# Patient Record
Sex: Male | Born: 1955 | Race: White | Hispanic: No | Marital: Married | Smoking: Current every day smoker
Health system: Southern US, Community
[De-identification: ages and names within clinical notes are randomized; demographics above are authoritative.]

## PROBLEM LIST (undated history)

## (undated) DIAGNOSIS — I2510 Atherosclerotic heart disease of native coronary artery without angina pectoris: Secondary | ICD-10-CM

## (undated) DIAGNOSIS — E119 Type 2 diabetes mellitus without complications: Secondary | ICD-10-CM

## (undated) DIAGNOSIS — I10 Essential (primary) hypertension: Secondary | ICD-10-CM

## (undated) DIAGNOSIS — T148XXA Other injury of unspecified body region, initial encounter: Secondary | ICD-10-CM

## (undated) DIAGNOSIS — M1990 Unspecified osteoarthritis, unspecified site: Secondary | ICD-10-CM

## (undated) DIAGNOSIS — G4730 Sleep apnea, unspecified: Secondary | ICD-10-CM

## (undated) HISTORY — PX: BACK SURGERY: SHX140

## (undated) HISTORY — PX: HERNIA REPAIR: SHX51

## (undated) HISTORY — DX: Atherosclerotic heart disease of native coronary artery without angina pectoris: I25.10

## (undated) HISTORY — PX: OTHER SURGICAL HISTORY: SHX169

## (undated) HISTORY — PX: APPENDECTOMY: SHX54

---

## 2010-07-09 ENCOUNTER — Other Ambulatory Visit: Payer: Self-pay | Admitting: Orthopedic Surgery

## 2010-07-09 ENCOUNTER — Encounter: Admission: RE | Admit: 2010-07-09 | Payer: Self-pay | Source: Home / Self Care | Admitting: Orthopedic Surgery

## 2010-07-09 DIAGNOSIS — M545 Low back pain, unspecified: Secondary | ICD-10-CM

## 2010-07-10 ENCOUNTER — Ambulatory Visit
Admission: RE | Admit: 2010-07-10 | Discharge: 2010-07-10 | Disposition: A | Discharge status: Home / Self Care | Payer: Self-pay | Source: Ambulatory Visit | Attending: Orthopedic Surgery | Admitting: Orthopedic Surgery

## 2010-07-10 DIAGNOSIS — M545 Low back pain, unspecified: Secondary | ICD-10-CM

## 2012-12-01 ENCOUNTER — Other Ambulatory Visit: Payer: Self-pay | Admitting: Anesthesiology

## 2012-12-01 DIAGNOSIS — M545 Low back pain, unspecified: Secondary | ICD-10-CM

## 2012-12-07 ENCOUNTER — Ambulatory Visit
Admission: RE | Admit: 2012-12-07 | Discharge: 2012-12-07 | Disposition: A | Discharge status: Home / Self Care | Payer: Medicare Other | Source: Ambulatory Visit | Attending: Anesthesiology | Admitting: Anesthesiology

## 2012-12-07 DIAGNOSIS — M545 Low back pain, unspecified: Secondary | ICD-10-CM

## 2015-07-25 DIAGNOSIS — M5136 Other intervertebral disc degeneration, lumbar region: Secondary | ICD-10-CM | POA: Diagnosis not present

## 2015-07-25 DIAGNOSIS — M47816 Spondylosis without myelopathy or radiculopathy, lumbar region: Secondary | ICD-10-CM | POA: Diagnosis not present

## 2015-07-25 DIAGNOSIS — M47812 Spondylosis without myelopathy or radiculopathy, cervical region: Secondary | ICD-10-CM | POA: Diagnosis not present

## 2015-07-25 DIAGNOSIS — M545 Low back pain: Secondary | ICD-10-CM | POA: Diagnosis not present

## 2015-10-05 DIAGNOSIS — G43009 Migraine without aura, not intractable, without status migrainosus: Secondary | ICD-10-CM | POA: Diagnosis not present

## 2015-10-05 DIAGNOSIS — I10 Essential (primary) hypertension: Secondary | ICD-10-CM | POA: Diagnosis not present

## 2015-10-05 DIAGNOSIS — K219 Gastro-esophageal reflux disease without esophagitis: Secondary | ICD-10-CM | POA: Diagnosis not present

## 2015-10-05 DIAGNOSIS — F324 Major depressive disorder, single episode, in partial remission: Secondary | ICD-10-CM | POA: Diagnosis not present

## 2015-10-05 DIAGNOSIS — G4733 Obstructive sleep apnea (adult) (pediatric): Secondary | ICD-10-CM | POA: Diagnosis not present

## 2015-10-05 DIAGNOSIS — M47816 Spondylosis without myelopathy or radiculopathy, lumbar region: Secondary | ICD-10-CM | POA: Diagnosis not present

## 2015-10-05 DIAGNOSIS — E119 Type 2 diabetes mellitus without complications: Secondary | ICD-10-CM | POA: Diagnosis not present

## 2015-10-05 DIAGNOSIS — Z79899 Other long term (current) drug therapy: Secondary | ICD-10-CM | POA: Diagnosis not present

## 2015-12-03 DIAGNOSIS — N401 Enlarged prostate with lower urinary tract symptoms: Secondary | ICD-10-CM | POA: Diagnosis not present

## 2015-12-03 DIAGNOSIS — N528 Other male erectile dysfunction: Secondary | ICD-10-CM | POA: Diagnosis not present

## 2015-12-03 DIAGNOSIS — Z87438 Personal history of other diseases of male genital organs: Secondary | ICD-10-CM | POA: Diagnosis not present

## 2015-12-03 DIAGNOSIS — I10 Essential (primary) hypertension: Secondary | ICD-10-CM | POA: Diagnosis not present

## 2015-12-03 DIAGNOSIS — N358 Other urethral stricture: Secondary | ICD-10-CM | POA: Diagnosis not present

## 2015-12-03 DIAGNOSIS — E119 Type 2 diabetes mellitus without complications: Secondary | ICD-10-CM | POA: Diagnosis not present

## 2015-12-21 DIAGNOSIS — N358 Other urethral stricture: Secondary | ICD-10-CM | POA: Diagnosis not present

## 2015-12-21 DIAGNOSIS — R3129 Other microscopic hematuria: Secondary | ICD-10-CM | POA: Diagnosis not present

## 2015-12-21 DIAGNOSIS — Z87438 Personal history of other diseases of male genital organs: Secondary | ICD-10-CM | POA: Diagnosis not present

## 2015-12-21 DIAGNOSIS — N401 Enlarged prostate with lower urinary tract symptoms: Secondary | ICD-10-CM | POA: Diagnosis not present

## 2015-12-21 DIAGNOSIS — N528 Other male erectile dysfunction: Secondary | ICD-10-CM | POA: Diagnosis not present

## 2015-12-31 DIAGNOSIS — S335XXA Sprain of ligaments of lumbar spine, initial encounter: Secondary | ICD-10-CM | POA: Diagnosis not present

## 2015-12-31 DIAGNOSIS — M47816 Spondylosis without myelopathy or radiculopathy, lumbar region: Secondary | ICD-10-CM | POA: Diagnosis not present

## 2016-01-31 DIAGNOSIS — Z7984 Long term (current) use of oral hypoglycemic drugs: Secondary | ICD-10-CM | POA: Diagnosis not present

## 2016-01-31 DIAGNOSIS — E119 Type 2 diabetes mellitus without complications: Secondary | ICD-10-CM | POA: Diagnosis not present

## 2016-01-31 DIAGNOSIS — M79642 Pain in left hand: Secondary | ICD-10-CM | POA: Diagnosis not present

## 2016-01-31 DIAGNOSIS — L02512 Cutaneous abscess of left hand: Secondary | ICD-10-CM | POA: Diagnosis not present

## 2016-01-31 DIAGNOSIS — I10 Essential (primary) hypertension: Secondary | ICD-10-CM | POA: Diagnosis not present

## 2016-01-31 DIAGNOSIS — F17200 Nicotine dependence, unspecified, uncomplicated: Secondary | ICD-10-CM | POA: Diagnosis not present

## 2016-01-31 DIAGNOSIS — S61412A Laceration without foreign body of left hand, initial encounter: Secondary | ICD-10-CM | POA: Diagnosis not present

## 2016-01-31 DIAGNOSIS — Z79899 Other long term (current) drug therapy: Secondary | ICD-10-CM | POA: Diagnosis not present

## 2016-02-05 DIAGNOSIS — I10 Essential (primary) hypertension: Secondary | ICD-10-CM | POA: Diagnosis not present

## 2016-02-05 DIAGNOSIS — Z23 Encounter for immunization: Secondary | ICD-10-CM | POA: Diagnosis not present

## 2016-02-05 DIAGNOSIS — E785 Hyperlipidemia, unspecified: Secondary | ICD-10-CM | POA: Diagnosis not present

## 2016-02-05 DIAGNOSIS — G4733 Obstructive sleep apnea (adult) (pediatric): Secondary | ICD-10-CM | POA: Diagnosis not present

## 2016-02-05 DIAGNOSIS — S61412A Laceration without foreign body of left hand, initial encounter: Secondary | ICD-10-CM | POA: Diagnosis not present

## 2016-02-05 DIAGNOSIS — E119 Type 2 diabetes mellitus without complications: Secondary | ICD-10-CM | POA: Diagnosis not present

## 2016-02-13 DIAGNOSIS — E119 Type 2 diabetes mellitus without complications: Secondary | ICD-10-CM | POA: Diagnosis not present

## 2016-02-22 DIAGNOSIS — N358 Other urethral stricture: Secondary | ICD-10-CM | POA: Diagnosis not present

## 2016-02-22 DIAGNOSIS — N401 Enlarged prostate with lower urinary tract symptoms: Secondary | ICD-10-CM | POA: Diagnosis not present

## 2016-02-22 DIAGNOSIS — R3914 Feeling of incomplete bladder emptying: Secondary | ICD-10-CM | POA: Diagnosis not present

## 2016-02-22 DIAGNOSIS — Z87438 Personal history of other diseases of male genital organs: Secondary | ICD-10-CM | POA: Diagnosis not present

## 2016-02-22 DIAGNOSIS — N528 Other male erectile dysfunction: Secondary | ICD-10-CM | POA: Diagnosis not present

## 2016-04-01 DIAGNOSIS — M545 Low back pain: Secondary | ICD-10-CM | POA: Diagnosis not present

## 2016-04-01 DIAGNOSIS — G894 Chronic pain syndrome: Secondary | ICD-10-CM | POA: Diagnosis not present

## 2016-04-01 DIAGNOSIS — G8929 Other chronic pain: Secondary | ICD-10-CM | POA: Diagnosis not present

## 2016-04-01 DIAGNOSIS — Z79891 Long term (current) use of opiate analgesic: Secondary | ICD-10-CM | POA: Diagnosis not present

## 2016-04-01 DIAGNOSIS — M5416 Radiculopathy, lumbar region: Secondary | ICD-10-CM | POA: Diagnosis not present

## 2016-09-25 DIAGNOSIS — R079 Chest pain, unspecified: Secondary | ICD-10-CM | POA: Diagnosis not present

## 2016-11-21 DIAGNOSIS — R3914 Feeling of incomplete bladder emptying: Secondary | ICD-10-CM | POA: Diagnosis not present

## 2016-11-21 DIAGNOSIS — N358 Other urethral stricture: Secondary | ICD-10-CM | POA: Diagnosis not present

## 2016-11-21 DIAGNOSIS — N528 Other male erectile dysfunction: Secondary | ICD-10-CM | POA: Diagnosis not present

## 2016-11-21 DIAGNOSIS — Z87438 Personal history of other diseases of male genital organs: Secondary | ICD-10-CM | POA: Diagnosis not present

## 2016-11-21 DIAGNOSIS — N401 Enlarged prostate with lower urinary tract symptoms: Secondary | ICD-10-CM | POA: Diagnosis not present

## 2016-12-12 ENCOUNTER — Encounter (HOSPITAL_COMMUNITY): Payer: Self-pay | Admitting: Emergency Medicine

## 2016-12-12 ENCOUNTER — Emergency Department (HOSPITAL_COMMUNITY)
Admission: EM | Admit: 2016-12-12 | Discharge: 2016-12-12 | Disposition: A | Discharge status: Home / Self Care | Payer: Medicare Other | Attending: Emergency Medicine | Admitting: Emergency Medicine

## 2016-12-12 ENCOUNTER — Emergency Department (HOSPITAL_COMMUNITY): Payer: Medicare Other

## 2016-12-12 DIAGNOSIS — Z79899 Other long term (current) drug therapy: Secondary | ICD-10-CM | POA: Insufficient documentation

## 2016-12-12 DIAGNOSIS — G8929 Other chronic pain: Secondary | ICD-10-CM | POA: Insufficient documentation

## 2016-12-12 DIAGNOSIS — S3992XA Unspecified injury of lower back, initial encounter: Secondary | ICD-10-CM | POA: Diagnosis not present

## 2016-12-12 DIAGNOSIS — M5442 Lumbago with sciatica, left side: Secondary | ICD-10-CM | POA: Diagnosis not present

## 2016-12-12 DIAGNOSIS — M545 Low back pain: Secondary | ICD-10-CM | POA: Diagnosis not present

## 2016-12-12 DIAGNOSIS — F17210 Nicotine dependence, cigarettes, uncomplicated: Secondary | ICD-10-CM | POA: Diagnosis not present

## 2016-12-12 MED ORDER — OXYCODONE-ACETAMINOPHEN 10-325 MG PO TABS: 1.0000 | ORAL_TABLET | ORAL | 0 refills | Status: DC | PRN

## 2016-12-12 MED ORDER — OXYCODONE HCL 5 MG PO TABS
10.0000 mg | ORAL_TABLET | Freq: Once | ORAL | Status: AC
  Administered 2016-12-12: 10 mg via ORAL
  Filled 2016-12-12: qty 2

## 2016-12-12 NOTE — Discharge Instructions (Signed)
Please read attached information. If you experience any new or worsening signs or symptoms please return to the emergency room for evaluation. Please follow-up with your primary care provider or specialist as discussed. Please use medication prescribed only as directed and discontinue taking if you have any concerning signs or symptoms.   °

## 2016-12-12 NOTE — ED Provider Notes (Signed)
[facility] DEPT Provider Note   CSN: [id number] Arrival date & time: 12/12/16  1236     History   Chief Complaint Chief Complaint  Patient presents with  . Back Pain    HPI Jordan Lambert is a 61 y.o. male.  HPI   61 year old male presents today with complaints of back pain.  Patient notes significant past medical history the same.  He has had cervical fusions, nerve stimulator placed for lower back pain.  Patient notes this was placed in 2015, he reports after placement he had no significant improvement in symptoms.  Patient reports he was on pain medication taking oxycodone daily as needed for pain, but stopped taking them approximately 8 months ago as they were not significantly improving his symptoms.  Patient notes that he continue to have distal sensory changes that are present today, they have been persistent since his neck injury.  He does note that recently his pain radiating to his left leg has gotten worse, worse in the lower left back and buttocks.  Patient walks with a limp per wife, but has been having more difficulty recently.  They deny any infectious etiology, denies any abdominal pain or recent trauma.  Patient reports he is not taking any home medications at this time.    History reviewed. No pertinent past medical history.  There are no active problems to display for this patient.   Past Surgical History:  Procedure Laterality Date  . BACK SURGERY    . neck fusion    . sciatica         Home Medications    Prior to Admission medications   Medication Sig Start Date End Date Taking? Authorizing Provider  Melatonin 5 MG TABS Take 10 mg by mouth at bedtime as needed (for sleep).    Yes [provider]  oxyCODONE-acetaminophen (PERCOCET) 10-325 MG tablet Take 1 tablet by mouth every 4 (four) hours as needed for pain. 12/12/16   Okey Regal, PA-C    Family History No family history on file.  Social History Social History  Substance Use  Topics  . Smoking status: Current Every Day Smoker    Packs/day: 1.00    Types: Cigarettes  . Smokeless tobacco: Never Used  . Alcohol use Yes     Comment: occasionally     Allergies   Patient has no known allergies.   Review of Systems Review of Systems  All other systems reviewed and are negative.    Physical Exam Updated Vital Signs BP 140/83 (BP Location: Right Arm)   Pulse 71   Temp 98.3 F (36.8 C) (Oral)   Resp 14   Ht 5\' 8"  (1.727 m)   Wt 79.4 kg (175 lb)   SpO2 99%   BMI 26.61 kg/m   Physical Exam  Constitutional: He is oriented to person, place, and time. He appears well-developed and well-nourished.  HENT:  Head: Normocephalic and atraumatic.  Eyes: Conjunctivae are normal. Pupils are equal, round, and reactive to light. Right eye exhibits no discharge. Left eye exhibits no discharge. No scleral icterus.  Neck: Normal range of motion. No JVD present. No tracheal deviation present.  Pulmonary/Chest: Effort normal. No stridor.  Musculoskeletal:  No CT or L-spine tenderness.  Exquisite tenderness palpation of left lateral lower lumbar soft tissue and upper gluteus-straight leg positive, distal sensation reduced throughout entire left lower extremity, strength 5 out of 5, pain with hip flexion both passive and active  Neurological: He is alert and oriented to person,  place, and time. Coordination normal.  Psychiatric: He has a normal mood and affect. His behavior is normal. Judgment and thought content normal.  Nursing note and vitals reviewed.    ED Treatments / Results  Labs (all labs ordered are listed, but only abnormal results are displayed) Labs Reviewed - No data to display  EKG  EKG Interpretation None       Radiology Dg Lumbar Spine Complete  Result Date: 12/12/2016 CLINICAL DATA:  Fall from tree. Pain. Prior surgery . EXAM: LUMBAR SPINE - COMPLETE 4+ VIEW COMPARISON:  Lumbar spine 03/01/2014. FINDINGS: No acute bony abnormality  identified. Diffuse degenerative change present. Aortoiliac atherosclerotic vascular calcification. Neurostimulator noted. IMPRESSION: 1. Diffuse degenerative change. No acute bony abnormality identified. 2. Aortoiliac atherosclerotic vascular disease . Electronically Signed   By: Marcello Moores  Register   On: 12/12/2016 14:55    Procedures Procedures (including critical care time)  Medications Ordered in ED Medications  oxyCODONE (Oxy IR/ROXICODONE) immediate release tablet 10 mg (10 mg Oral Given 12/12/16 1413)     Initial Impression / Assessment and Plan / ED Course  I have reviewed the triage vital signs and the nursing notes.  Pertinent labs & imaging results that were available during my care of the patient were reviewed by me and considered in my medical decision making (see chart for details).      Final Clinical Impressions(s) / ED Diagnoses   Final diagnoses:  Chronic left-sided low back pain with left-sided sciatica    Labs:   Imaging: DG lumbar  Consults:  Therapeutics: Oxycodone  Discharge Meds: Percocet  Assessment/Plan: 61 year old male presents today with complaints of back pain.  Patient has a significant past medical history of the same.  Patient has a nerve stimulator implanted that does not improve his symptoms.  He has been managing symptoms at home without pain medication.  Patient is having worsening of symptoms.  He has neurological deficits in the left lower extremity that are chronic and at baseline.  Patient can ambulate but does have some difficulty with this.  Plain films show no acute fractures.  Patient symptoms slightly improved here with medications.  He will follow-up as an outpatient with neurosurgery for ongoing management, he is given strict return precautions.  He verbalized understanding and agreement to today's plan had no further questions or concerns at time discharge.    New Prescriptions Discharge Medication List as of 12/12/2016  4:07 PM      START taking these medications   Details  oxyCODONE-acetaminophen (PERCOCET) 10-325 MG tablet Take 1 tablet by mouth every 4 (four) hours as needed for pain., Starting Fri 12/12/2016, Print         Jackilyn Umphlett, [place], PA-C 12/12/16 1635    Forde Dandy, MD 12/12/16 [phone]

## 2016-12-12 NOTE — ED Triage Notes (Signed)
Reports having hx of back pain with a nerve stimulator in lower back.  For past couple of days worse than usual.  Has not taken anything for pain.  Reports pain meds weren't working has not taken any for 7-8 months.

## 2016-12-19 DIAGNOSIS — N401 Enlarged prostate with lower urinary tract symptoms: Secondary | ICD-10-CM | POA: Diagnosis not present

## 2016-12-23 DIAGNOSIS — M542 Cervicalgia: Secondary | ICD-10-CM | POA: Diagnosis not present

## 2016-12-23 DIAGNOSIS — G959 Disease of spinal cord, unspecified: Secondary | ICD-10-CM | POA: Diagnosis not present

## 2016-12-23 DIAGNOSIS — M545 Low back pain: Secondary | ICD-10-CM | POA: Diagnosis not present

## 2016-12-23 DIAGNOSIS — R030 Elevated blood-pressure reading, without diagnosis of hypertension: Secondary | ICD-10-CM | POA: Diagnosis not present

## 2016-12-23 DIAGNOSIS — Z6826 Body mass index (BMI) 26.0-26.9, adult: Secondary | ICD-10-CM | POA: Diagnosis not present

## 2016-12-24 ENCOUNTER — Other Ambulatory Visit: Payer: Self-pay | Admitting: Neurological Surgery

## 2016-12-24 DIAGNOSIS — M542 Cervicalgia: Secondary | ICD-10-CM

## 2016-12-24 DIAGNOSIS — M545 Low back pain: Secondary | ICD-10-CM

## 2016-12-24 DIAGNOSIS — G8929 Other chronic pain: Secondary | ICD-10-CM

## 2017-01-01 ENCOUNTER — Ambulatory Visit
Admission: RE | Admit: 2017-01-01 | Discharge: 2017-01-01 | Disposition: A | Discharge status: Home / Self Care | Payer: Medicare Other | Source: Ambulatory Visit | Attending: Neurological Surgery | Admitting: Neurological Surgery

## 2017-01-01 ENCOUNTER — Other Ambulatory Visit: Payer: Self-pay | Admitting: Neurological Surgery

## 2017-01-01 DIAGNOSIS — M545 Low back pain: Secondary | ICD-10-CM

## 2017-01-01 DIAGNOSIS — M5126 Other intervertebral disc displacement, lumbar region: Secondary | ICD-10-CM | POA: Diagnosis not present

## 2017-01-01 DIAGNOSIS — M4802 Spinal stenosis, cervical region: Secondary | ICD-10-CM | POA: Diagnosis not present

## 2017-01-01 DIAGNOSIS — G8929 Other chronic pain: Secondary | ICD-10-CM

## 2017-01-01 DIAGNOSIS — M542 Cervicalgia: Secondary | ICD-10-CM

## 2017-01-01 MED ORDER — IOPAMIDOL (ISOVUE-M 300) INJECTION 61%
10.0000 mL | Freq: Once | INTRAMUSCULAR | Status: AC | PRN
  Administered 2017-01-01: 10 mL via INTRATHECAL

## 2017-01-01 MED ORDER — DIAZEPAM 5 MG PO TABS
5.0000 mg | ORAL_TABLET | Freq: Once | ORAL | Status: AC
  Administered 2017-01-01: 5 mg via ORAL

## 2017-01-01 NOTE — Discharge Instructions (Signed)

## 2017-01-13 ENCOUNTER — Other Ambulatory Visit: Payer: Self-pay | Admitting: Neurological Surgery

## 2017-01-13 DIAGNOSIS — R030 Elevated blood-pressure reading, without diagnosis of hypertension: Secondary | ICD-10-CM | POA: Diagnosis not present

## 2017-01-13 DIAGNOSIS — M4802 Spinal stenosis, cervical region: Secondary | ICD-10-CM | POA: Diagnosis not present

## 2017-01-13 DIAGNOSIS — Z6825 Body mass index (BMI) 25.0-25.9, adult: Secondary | ICD-10-CM | POA: Diagnosis not present

## 2017-01-15 DIAGNOSIS — J343 Hypertrophy of nasal turbinates: Secondary | ICD-10-CM | POA: Diagnosis not present

## 2017-01-15 DIAGNOSIS — J384 Edema of larynx: Secondary | ICD-10-CM | POA: Diagnosis not present

## 2017-01-15 DIAGNOSIS — M4802 Spinal stenosis, cervical region: Secondary | ICD-10-CM | POA: Diagnosis not present

## 2017-01-15 DIAGNOSIS — F17210 Nicotine dependence, cigarettes, uncomplicated: Secondary | ICD-10-CM | POA: Diagnosis not present

## 2017-01-20 NOTE — Pre-Procedure Instructions (Signed)
Aren Pryde  01/20/2017      [facility] - [place], [place], [place] [address] [place] [address] Phone: [phone] Fax: [phone]    Your procedure is scheduled on Aug. 23  Report to [facility] at 530 A.M.  Call this number if you have problems the morning of surgery:  [phone]   Remember:  Do not eat food or drink liquids after midnight.  Take these medicines the morning of surgery with A SIP OF WATER none  Stop taking aspirin, BC's, Goody's, Herbal medications, Fish Oil, Aleve, Ibuprofen, Advil, Motrin   Do not wear jewelry, make-up or nail polish.  Do not wear lotions, powders, or perfumes, or deoderant.  Do not shave 48 hours prior to surgery.  Men may shave face and neck.  Do not bring valuables to the hospital.  [facility] is not responsible for any belongings or valuables.  Contacts, dentures or bridgework may not be worn into surgery.  Leave your suitcase in the car.  After surgery it may be brought to your room.  For patients admitted to the hospital, discharge time will be determined by your treatment team.  Patients discharged the day of surgery will not be allowed to drive home.   Special instructions:  [facility] - Preparing for Surgery  Before surgery, you can play an important role.  Because skin is not sterile, your skin needs to be as free of germs as possible.  You can reduce the number of germs on you skin by washing with CHG (chlorahexidine gluconate) soap before surgery.  CHG is an antiseptic cleaner which kills germs and bonds with the skin to continue killing germs even after washing.  Please DO NOT use if you have an allergy to CHG or antibacterial soaps.  If your skin becomes reddened/irritated stop using the CHG and inform your nurse when you arrive at Short Stay.  Do not shave (including legs and underarms) for at least 48 hours prior to the first CHG shower.  You may shave your  face.  Please follow these instructions carefully:   1.  Shower with CHG Soap the night before surgery and the                                morning of Surgery.  2.  If you choose to wash your hair, wash your hair first as usual with your       normal shampoo.  3.  After you shampoo, rinse your hair and body thoroughly to remove the                      Shampoo.  4.  Use CHG as you would any other liquid soap.  You can apply chg directly       to the skin and wash gently with scrungie or a clean washcloth.  5.  Apply the CHG Soap to your body ONLY FROM THE NECK DOWN.        Do not use on open wounds or open sores.  Avoid contact with your eyes,       ears, mouth and genitals (private parts).  Wash genitals (private parts)       with your normal soap.  6.  Wash thoroughly, paying special attention to the area where your surgery  will be performed.  7.  Thoroughly rinse your body with warm water from the neck down.  8.  DO NOT shower/wash with your normal soap after using and rinsing off       the CHG Soap.  9.  Pat yourself dry with a clean towel.            10.  Wear clean pajamas.            11.  Place clean sheets on your bed the night of your first shower and do not        sleep with pets.  Day of Surgery  Do not apply any lotions/deoderants the morning of surgery.  Please wear clean clothes to the hospital/surgery center.     Please read over the following fact sheets that you were given. Pain Booklet, Coughing and Deep Breathing, MRSA Information and Surgical Site Infection Prevention

## 2017-01-21 ENCOUNTER — Ambulatory Visit (HOSPITAL_COMMUNITY)
Admission: RE | Admit: 2017-01-21 | Discharge: 2017-01-21 | Disposition: A | Discharge status: Home / Self Care | Payer: Medicare Other | Source: Ambulatory Visit | Attending: Neurological Surgery | Admitting: Neurological Surgery

## 2017-01-21 ENCOUNTER — Encounter (HOSPITAL_COMMUNITY): Payer: Self-pay

## 2017-01-21 ENCOUNTER — Encounter (HOSPITAL_COMMUNITY)
Admission: RE | Admit: 2017-01-21 | Discharge: 2017-01-21 | Disposition: A | Discharge status: Home / Self Care | Payer: Medicare Other | Source: Ambulatory Visit | Attending: Neurological Surgery | Admitting: Neurological Surgery

## 2017-01-21 DIAGNOSIS — F17200 Nicotine dependence, unspecified, uncomplicated: Secondary | ICD-10-CM | POA: Diagnosis not present

## 2017-01-21 DIAGNOSIS — M4802 Spinal stenosis, cervical region: Secondary | ICD-10-CM

## 2017-01-21 DIAGNOSIS — Z01810 Encounter for preprocedural cardiovascular examination: Secondary | ICD-10-CM | POA: Insufficient documentation

## 2017-01-21 DIAGNOSIS — G4733 Obstructive sleep apnea (adult) (pediatric): Secondary | ICD-10-CM | POA: Insufficient documentation

## 2017-01-21 DIAGNOSIS — R079 Chest pain, unspecified: Secondary | ICD-10-CM | POA: Diagnosis not present

## 2017-01-21 DIAGNOSIS — I10 Essential (primary) hypertension: Secondary | ICD-10-CM | POA: Insufficient documentation

## 2017-01-21 DIAGNOSIS — E119 Type 2 diabetes mellitus without complications: Secondary | ICD-10-CM | POA: Diagnosis not present

## 2017-01-21 DIAGNOSIS — Z01812 Encounter for preprocedural laboratory examination: Secondary | ICD-10-CM | POA: Insufficient documentation

## 2017-01-21 HISTORY — DX: Type 2 diabetes mellitus without complications: E11.9

## 2017-01-21 HISTORY — DX: Sleep apnea, unspecified: G47.30

## 2017-01-21 HISTORY — DX: Other injury of unspecified body region, initial encounter: T14.8XXA

## 2017-01-21 HISTORY — DX: Essential (primary) hypertension: I10

## 2017-01-21 LAB — CBC WITH DIFFERENTIAL/PLATELET
Basophils Absolute: 0 10*3/uL (ref 0.0–0.1)
Basophils Relative: 0 %
EOS ABS: 0.1 10*3/uL (ref 0.0–0.7)
Eosinophils Relative: 1 %
HCT: 48.6 % (ref 39.0–52.0)
HEMOGLOBIN: 16.9 g/dL (ref 13.0–17.0)
LYMPHS ABS: 2.9 10*3/uL (ref 0.7–4.0)
Lymphocytes Relative: 23 %
MCH: 31 pg (ref 26.0–34.0)
MCHC: 34.8 g/dL (ref 30.0–36.0)
MCV: 89 fL (ref 78.0–100.0)
Monocytes Absolute: 0.7 10*3/uL (ref 0.1–1.0)
Monocytes Relative: 5 %
NEUTROS PCT: 71 %
Neutro Abs: 8.8 10*3/uL — ABNORMAL HIGH (ref 1.7–7.7)
Platelets: 239 10*3/uL (ref 150–400)
RBC: 5.46 MIL/uL (ref 4.22–5.81)
RDW: 13 % (ref 11.5–15.5)
WBC: 12.5 10*3/uL — AB (ref 4.0–10.5)

## 2017-01-21 LAB — PROTIME-INR
INR: 0.97
PROTHROMBIN TIME: 12.9 s (ref 11.4–15.2)

## 2017-01-21 LAB — BASIC METABOLIC PANEL
ANION GAP: 9 (ref 5–15)
BUN: <5 mg/dL — ABNORMAL LOW (ref 6–20)
CHLORIDE: 105 mmol/L (ref 101–111)
CO2: 24 mmol/L (ref 22–32)
CREATININE: 0.9 mg/dL (ref 0.61–1.24)
Calcium: 9.6 mg/dL (ref 8.9–10.3)
GFR calc non Af Amer: >60 mL/min (ref >60)
Glucose, Bld: 117 mg/dL — ABNORMAL HIGH (ref 65–99)
POTASSIUM: 4 mmol/L (ref 3.5–5.1)
SODIUM: 138 mmol/L (ref 135–145)

## 2017-01-21 LAB — SURGICAL PCR SCREEN
MRSA, PCR: NEGATIVE
Staphylococcus aureus: NEGATIVE

## 2017-01-21 LAB — GLUCOSE, CAPILLARY: Glucose-Capillary: 113 mg/dL — ABNORMAL HIGH (ref 65–99)

## 2017-01-21 NOTE — Progress Notes (Addendum)
Denies having a PCP Denies ever seeing a cardiologist.  Denies any chest pain, fever, or cough. Instructed not to smoke 24 hours prior of surgery- voices understanding.  CBG today was 113 reports he usually runs around 120- doesn't take any meds for his DM Reports he has a Cpap, but doesn't wear it often, instructed to bring in his mask on the day of surgery. Request sent to [facility] for EKG, CXR and visit note. (April 2018) Pt. States everything was fine.

## 2017-01-21 NOTE — Progress Notes (Signed)
Anesthesia Chart Review:  Pt is a 61 year old male scheduled for C3-4 ACDF on 01/29/2017 with Sherley Bounds, MD  - No PCP.   PMH includes:  HTN (no longer on meds), DM (no longer on meds), OSA.  Current smoker. BMI 26  - ED visit to [facility] 09/25/16 for chest pain. Troponin x1 negative. Pt left AMA.   Patient denies medications.  BP 136/89   Pulse 75   Temp 36.8 C   Resp 20   Ht 5\' 8"  (1.727 m)   Wt 170 lb 1.6 oz (77.2 kg)   SpO2 99%   BMI 25.86 kg/m   Preoperative labs reviewed.  Glucose 117. HbA1c pending.   CXR 01/21/17: No acute disease.  EKG 09/25/16 [facility]): Sinus rhythm. PVCs. Anteroseptal infarct, old. ST elevation, consider inferior injury.  Reviewed case with Dr. Lissa Hoard.  Pt will need medical clearance prior to surgery. I notified Lorriane Shire in Dr. Ronnald Ramp' office.   Willeen Cass, FNP-BC [facility] Short Stay Surgical Center/Anesthesiology Phone: [phone] 01/21/2017 4:39 PM

## 2017-01-22 DIAGNOSIS — R9431 Abnormal electrocardiogram [ECG] [EKG]: Secondary | ICD-10-CM | POA: Diagnosis not present

## 2017-01-22 DIAGNOSIS — Z6826 Body mass index (BMI) 26.0-26.9, adult: Secondary | ICD-10-CM | POA: Diagnosis not present

## 2017-01-22 DIAGNOSIS — Z713 Dietary counseling and surveillance: Secondary | ICD-10-CM | POA: Diagnosis not present

## 2017-01-22 DIAGNOSIS — R739 Hyperglycemia, unspecified: Secondary | ICD-10-CM | POA: Diagnosis not present

## 2017-01-22 DIAGNOSIS — Z299 Encounter for prophylactic measures, unspecified: Secondary | ICD-10-CM | POA: Diagnosis not present

## 2017-01-22 LAB — HEMOGLOBIN A1C
Hgb A1c MFr Bld: 6 % — ABNORMAL HIGH (ref 4.8–5.6)
MEAN PLASMA GLUCOSE: 126 mg/dL

## 2017-01-27 DIAGNOSIS — Z01810 Encounter for preprocedural cardiovascular examination: Secondary | ICD-10-CM | POA: Diagnosis not present

## 2017-01-27 DIAGNOSIS — R079 Chest pain, unspecified: Secondary | ICD-10-CM | POA: Diagnosis not present

## 2017-02-06 ENCOUNTER — Encounter: Payer: Self-pay | Admitting: Cardiology

## 2017-02-06 ENCOUNTER — Ambulatory Visit (INDEPENDENT_AMBULATORY_CARE_PROVIDER_SITE_OTHER): Payer: Medicare Other | Admitting: Cardiology

## 2017-02-06 ENCOUNTER — Telehealth: Payer: Self-pay | Admitting: Cardiovascular Disease

## 2017-02-06 VITALS — BP 132/78 | HR 93 | Ht 68.0 in | Wt 170.0 lb

## 2017-02-06 DIAGNOSIS — Z01810 Encounter for preprocedural cardiovascular examination: Secondary | ICD-10-CM

## 2017-02-06 DIAGNOSIS — R9439 Abnormal result of other cardiovascular function study: Secondary | ICD-10-CM

## 2017-02-06 DIAGNOSIS — R079 Chest pain, unspecified: Secondary | ICD-10-CM | POA: Diagnosis not present

## 2017-02-06 NOTE — Telephone Encounter (Signed)
Echo - chest pain -  [facility] Na Sept 5, 2018

## 2017-02-06 NOTE — Patient Instructions (Signed)
Medication Instructions:  Continue all current medications.  Labwork: none  Testing/Procedures:  Your physician has requested that you have an echocardiogram. Echocardiography is a painless test that uses sound waves to create images of your heart. It provides your doctor with information about the size and shape of your heart and how well your heart's chambers and valves are working. This procedure takes approximately one hour. There are no restrictions for this procedure.  Office will contact with results via phone or letter.    Follow-Up: Pending   Any Other Special Instructions Will Be Listed Below (If Applicable).  If you need a refill on your cardiac medications before your next appointment, please call your pharmacy.

## 2017-02-06 NOTE — Progress Notes (Addendum)
Clinical Summary Jordan Lambert is a 61 y.o.male seen as new patient  1. Chest pain/Abnormal stress test - recent ER visit with chest pain -Lexiscan nuclear stress test at [facility] 01/2017: medium sized area of mild reversibility inferior wall. Inferior hypokinesis. LVEF 46%. Intermediate risk  - isolated episode 09/2016. Seen in ER at that time, left AMA - tightness midchest, 5/10 in severity. Could occur at rest or with activity. No other associated symptoms.Symptoms lasted about 3 hours.  - symptoms resolved. No recurrent symptoms.  - denies any SOB/DOE. Highest level of activity is using push mower which he tolerates without troubles, uses up to 15 minutes. Sedentary lifestyle due to chronic neck and hip pains. Reports can walk up to 2 flights of stairs without troubles.   CAD risk factors: HTN, Prior DM2 and HL that resolved with weight loss, +tobacco, reports older brother with "heart troubles"   2. Preoperative evaluation - planned for Sept 14.     Past Medical History:  Diagnosis Date  . Diabetes mellitus without complication (HCC)    not on meds  . Hypertension    has been taken off bp meds   . 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 Sleep apnea      No Known Allergies   No current outpatient prescriptions on file.   No current facility-administered medications for this visit.      Past Surgical History:  Procedure Laterality Date  . APPENDECTOMY    . BACK SURGERY    . HERNIA REPAIR    . neck fusion    . nerve stimulor placed    . sciatica       No Known Allergies    No family history on file.   Social History Mr. Bourke reports that he has been smoking Cigarettes.  He has been smoking about 1.00 pack per day. He has never used smokeless tobacco. Mr. Eagon reports that he drinks alcohol.   Review of Systems CONSTITUTIONAL: No weight loss, fever, chills, weakness or fatigue.  HEENT: Eyes: No visual loss, blurred vision, double vision  or yellow sclerae.No hearing loss, sneezing, congestion, runny nose or sore throat.  SKIN: No rash or itching.  CARDIOVASCULAR: per hpi RESPIRATORY: No shortness of breath, cough or sputum.  GASTROINTESTINAL: No anorexia, nausea, vomiting or diarrhea. No abdominal pain or blood.  GENITOURINARY: No burning on urination, no polyuria NEUROLOGICAL: No headache, dizziness, syncope, paralysis, ataxia, numbness or tingling in the extremities. No change in bowel or bladder control.  MUSCULOSKELETAL: +back pain LYMPHATICS: No enlarged nodes. No history of splenectomy.  PSYCHIATRIC: No history of depression or anxiety.  ENDOCRINOLOGIC: No reports of sweating, cold or heat intolerance. No polyuria or polydipsia.  Marland Kitchen   Physical Examination Vitals:   02/06/17 1324 02/06/17 1327  BP: (!) 150/88 132/78  Pulse: 94 93  SpO2: 95% 97%   Vitals:   02/06/17 1324  Weight: 170 lb (77.1 kg)  Height: 5\' 8"  (1.727 m)     Gen: resting comfortably, no acute distress HEENT: no scleral icterus, pupils equal round and reactive, no palptable cervical adenopathy,  CV: RRR, no m/r/g, no jvd Resp: Clear to auscultation bilaterally GI: abdomen is soft, non-tender, non-distended, normal bowel sounds, no hepatosplenomegaly MSK: extremities are warm, no edema.  Skin: warm, no rash Neuro:  no focal deficits Psych: appropriate affect     Assessment and Plan  1. Chest pain/Preop evaluation/abnormal stress test - isolated episode  of chest pain - no recurrent episodes. Nuclear stress with mild ischemia,suggests decreased LVEF. We will obtain an echo to further risk stratify - he tolerates greater than 4 METs without limitation - if echo with normal LVEF would plan with proceeding with surgery.       Jordan Lambert, M.D.  Addendum 02/16/17 Normal echo. Recommend proceeding with surgery as planned  Jordan Dolly MD

## 2017-02-11 ENCOUNTER — Ambulatory Visit (HOSPITAL_COMMUNITY)
Admission: RE | Admit: 2017-02-11 | Discharge: 2017-02-11 | Disposition: A | Discharge status: Home / Self Care | Payer: Medicare Other | Source: Ambulatory Visit | Attending: Internal Medicine | Admitting: Internal Medicine

## 2017-02-11 DIAGNOSIS — R079 Chest pain, unspecified: Secondary | ICD-10-CM | POA: Insufficient documentation

## 2017-02-11 DIAGNOSIS — G4730 Sleep apnea, unspecified: Secondary | ICD-10-CM | POA: Diagnosis not present

## 2017-02-11 DIAGNOSIS — I10 Essential (primary) hypertension: Secondary | ICD-10-CM | POA: Insufficient documentation

## 2017-02-11 DIAGNOSIS — E119 Type 2 diabetes mellitus without complications: Secondary | ICD-10-CM | POA: Insufficient documentation

## 2017-02-11 LAB — ECHOCARDIOGRAM COMPLETE
AVLVOTPG: 4 mmHg
CHL CUP DOP CALC LVOT VTI: 19.7 cm
CHL CUP MV DEC (S): 310
E/e' ratio: 7.4
EWDT: 310 ms
FS: 35 % (ref 28–44)
IVS/LV PW RATIO, ED: 1.03
LA ID, A-P, ES: 33 mm
LA diam end sys: 33 mm
LA diam index: 1.71 cm/m2
LA vol A4C: 40.1 ml
LA vol: 39.3 mL
LAVOLIN: 20.3 mL/m2
LDCA: 3.14 cm2
LV E/e' medial: 7.4
LV E/e'average: 7.4
LV PW d: 10.9 mm — AB (ref 0.6–1.1)
LV TDI E'MEDIAL: 5.55
LV dias vol index: 42 mL/m2
LV e' LATERAL: 8.27 cm/s
LV sys vol index: 15 mL/m2
LV sys vol: 29 mL (ref 21–61)
LVDIAVOL: 82 mL (ref 62–150)
LVOT SV: 62 mL
LVOT diameter: 20 mm
LVOTPV: 99.9 cm/s
Lateral S' vel: 14.4 cm/s
MV pk A vel: 83.2 m/s
MVPKEVEL: 61.2 m/s
Simpson's disk: 65
Stroke v: 53 ml
TAPSE: 19.5 mm
TDI e' lateral: 8.27

## 2017-02-11 NOTE — Progress Notes (Signed)
*  PRELIMINARY RESULTS* Echocardiogram 2D Echocardiogram has been performed.  Jordan Lambert 02/11/2017, 3:04 PM

## 2017-02-13 ENCOUNTER — Telehealth: Payer: Self-pay | Admitting: Cardiology

## 2017-02-13 NOTE — Telephone Encounter (Signed)
Patient advised that results have not been reviewed by his doctor and once it has been reviewed that he would receive a call with his results. Patient verbalized understanding.

## 2017-02-13 NOTE — Telephone Encounter (Signed)
Patient is requesting echo results  . Please call cell # [phone].

## 2017-02-16 DIAGNOSIS — E1165 Type 2 diabetes mellitus with hyperglycemia: Secondary | ICD-10-CM | POA: Diagnosis not present

## 2017-02-16 DIAGNOSIS — R9439 Abnormal result of other cardiovascular function study: Secondary | ICD-10-CM | POA: Diagnosis not present

## 2017-02-16 DIAGNOSIS — I10 Essential (primary) hypertension: Secondary | ICD-10-CM | POA: Diagnosis not present

## 2017-02-16 DIAGNOSIS — I5189 Other ill-defined heart diseases: Secondary | ICD-10-CM | POA: Diagnosis not present

## 2017-02-16 DIAGNOSIS — M542 Cervicalgia: Secondary | ICD-10-CM | POA: Diagnosis not present

## 2017-02-16 DIAGNOSIS — Z6826 Body mass index (BMI) 26.0-26.9, adult: Secondary | ICD-10-CM | POA: Diagnosis not present

## 2017-02-16 DIAGNOSIS — Z299 Encounter for prophylactic measures, unspecified: Secondary | ICD-10-CM | POA: Diagnosis not present

## 2017-02-16 NOTE — Telephone Encounter (Signed)
Pt walked into office and verbally gave results - routed echo results to [facility]    Echo overall looks good, heart pumping function is normal. There is nothing from a heart standpoint to keep him from his surgery. Please forward my clinic note to his surgeon    Zandra Abts MD

## 2017-02-16 NOTE — Telephone Encounter (Signed)
Please see result note and contact patient, I believe his surgery is later this week. He is cleared for it from our standpoint  Zandra Abts MD

## 2017-02-18 NOTE — Progress Notes (Signed)
Anesthesia Chart Review:  Pt is a same day work up.   Pt is a 61 year old male scheduled for C3-4 ACDF on 01/29/2017 with Sherley Bounds, MD  Surgery was originally scheduled for 01/29/17 but was postponed until pt could find and be evaluated by a PCP and cardiology.   - now receiving primary care at [facility]; pt has been cleared for surgery.  - Saw cardiologist Carlyle Dolly, MD 02/05/17.  Stress test and echo ordered, results below. Pt cleared for surgery   PMH includes:  HTN, DM (no longer on meds), OSA.  Current smoker. BMI 26  Medications include: lisinopril.   Labs will be obtained DOS. HbA1c was 6.0 on 01/21/17  CXR 01/21/17: No acute disease.  EKG 09/25/16 [facility]): Sinus rhythm. PVCs. Anteroseptal infarct, old. ST elevation, consider inferior injury.  Echo 02/11/17:  - Left ventricle: The cavity size was normal. Wall thickness was increased in a pattern of mild LVH. Systolic function was normal. The estimated ejection fraction was in the range of 50% to 55%. Doppler parameters are consistent with abnormal left ventricular relaxation (grade 1 diastolic dysfunction).  By notes, pt had nuclear stress test 01/2017 at [facility] that showed: medium sized area of mild reversibility inferior wall. Inferior hypokinesis. LVEF 46%. Intermediate risk  If labs acceptable DOS, I anticipate pt can proceed as scheduled.   Willeen Cass, FNP-BC [facility] Short Stay Surgical Center/Anesthesiology Phone: [phone] 02/18/2017 4:24 PM

## 2017-02-19 ENCOUNTER — Encounter (HOSPITAL_COMMUNITY): Payer: Self-pay | Admitting: *Deleted

## 2017-02-19 NOTE — Progress Notes (Signed)
Spoke with pt for pre-op call. Pt has cardiac clearance from Dr. Harl Bowie and medical clearance from his PCP. Pt is type 2 diabetic, not on any medications. Last A1C was 6.0 on 01/21/17. Pt instructed to check his blood sugar in the morning when he gets up and every 2 hours prior to leaving for the hospital. If blood sugar is 70 or below, treat with 1/2 cup of clear juice (apple or cranberry) and recheck blood sugar 15 minutes after drinking juice. If blood sugar continues to be 70 or below, call the Short Stay department and ask to speak to a nurse. Pt voiced understanding.

## 2017-02-20 ENCOUNTER — Observation Stay (HOSPITAL_COMMUNITY): Payer: Medicare Other

## 2017-02-20 ENCOUNTER — Observation Stay (HOSPITAL_COMMUNITY)
Admission: RE | Admit: 2017-02-20 | Discharge: 2017-02-21 | Disposition: A | Discharge status: Home / Self Care | Payer: Medicare Other | Source: Ambulatory Visit | Attending: Neurological Surgery | Admitting: Neurological Surgery

## 2017-02-20 ENCOUNTER — Encounter (HOSPITAL_COMMUNITY)
Admission: RE | Disposition: A | Discharge status: Home / Self Care | Payer: Self-pay | Source: Ambulatory Visit | Attending: Neurological Surgery

## 2017-02-20 ENCOUNTER — Inpatient Hospital Stay (HOSPITAL_COMMUNITY): Payer: Medicare Other | Admitting: Emergency Medicine

## 2017-02-20 ENCOUNTER — Encounter (HOSPITAL_COMMUNITY): Payer: Self-pay | Admitting: Neurological Surgery

## 2017-02-20 DIAGNOSIS — F17210 Nicotine dependence, cigarettes, uncomplicated: Secondary | ICD-10-CM | POA: Diagnosis not present

## 2017-02-20 DIAGNOSIS — I10 Essential (primary) hypertension: Secondary | ICD-10-CM | POA: Insufficient documentation

## 2017-02-20 DIAGNOSIS — G6289 Other specified polyneuropathies: Secondary | ICD-10-CM | POA: Diagnosis not present

## 2017-02-20 DIAGNOSIS — G4730 Sleep apnea, unspecified: Secondary | ICD-10-CM | POA: Insufficient documentation

## 2017-02-20 DIAGNOSIS — E118 Type 2 diabetes mellitus with unspecified complications: Secondary | ICD-10-CM | POA: Diagnosis not present

## 2017-02-20 DIAGNOSIS — M1990 Unspecified osteoarthritis, unspecified site: Secondary | ICD-10-CM | POA: Diagnosis not present

## 2017-02-20 DIAGNOSIS — M4802 Spinal stenosis, cervical region: Principal | ICD-10-CM | POA: Insufficient documentation

## 2017-02-20 DIAGNOSIS — Z419 Encounter for procedure for purposes other than remedying health state, unspecified: Secondary | ICD-10-CM

## 2017-02-20 DIAGNOSIS — I2510 Atherosclerotic heart disease of native coronary artery without angina pectoris: Secondary | ICD-10-CM | POA: Insufficient documentation

## 2017-02-20 DIAGNOSIS — Z79899 Other long term (current) drug therapy: Secondary | ICD-10-CM | POA: Diagnosis not present

## 2017-02-20 DIAGNOSIS — M4712 Other spondylosis with myelopathy, cervical region: Secondary | ICD-10-CM | POA: Insufficient documentation

## 2017-02-20 DIAGNOSIS — M5136 Other intervertebral disc degeneration, lumbar region: Secondary | ICD-10-CM | POA: Diagnosis not present

## 2017-02-20 DIAGNOSIS — M4322 Fusion of spine, cervical region: Secondary | ICD-10-CM | POA: Diagnosis present

## 2017-02-20 DIAGNOSIS — Z981 Arthrodesis status: Secondary | ICD-10-CM | POA: Diagnosis not present

## 2017-02-20 HISTORY — DX: Unspecified osteoarthritis, unspecified site: M19.90

## 2017-02-20 HISTORY — PX: ANTERIOR CERVICAL DECOMP/DISCECTOMY FUSION: SHX1161

## 2017-02-20 LAB — CBC
HCT: 48.6 % (ref 39.0–52.0)
HEMOGLOBIN: 16.6 g/dL (ref 13.0–17.0)
MCH: 31.3 pg (ref 26.0–34.0)
MCHC: 34.2 g/dL (ref 30.0–36.0)
MCV: 91.7 fL (ref 78.0–100.0)
PLATELETS: 242 10*3/uL (ref 150–400)
RBC: 5.3 MIL/uL (ref 4.22–5.81)
RDW: 13.3 % (ref 11.5–15.5)
WBC: 12.8 10*3/uL — ABNORMAL HIGH (ref 4.0–10.5)

## 2017-02-20 LAB — BASIC METABOLIC PANEL
Anion gap: 8 (ref 5–15)
BUN: 6 mg/dL (ref 6–20)
CALCIUM: 9.2 mg/dL (ref 8.9–10.3)
CHLORIDE: 109 mmol/L (ref 101–111)
CO2: 20 mmol/L — AB (ref 22–32)
CREATININE: 0.82 mg/dL (ref 0.61–1.24)
GFR calc Af Amer: >60 mL/min (ref >60)
GFR calc non Af Amer: >60 mL/min (ref >60)
GLUCOSE: 121 mg/dL — AB (ref 65–99)
Potassium: 4.1 mmol/L (ref 3.5–5.1)
Sodium: 137 mmol/L (ref 135–145)

## 2017-02-20 LAB — GLUCOSE, CAPILLARY
GLUCOSE-CAPILLARY: 133 mg/dL — AB (ref 65–99)
GLUCOSE-CAPILLARY: 139 mg/dL — AB (ref 65–99)
GLUCOSE-CAPILLARY: 118 mg/dL — AB (ref 65–99)
Glucose-Capillary: 89 mg/dL (ref 65–99)

## 2017-02-20 SURGERY — ANTERIOR CERVICAL DECOMPRESSION/DISCECTOMY FUSION 1 LEVEL
Anesthesia: General | Site: Spine Cervical

## 2017-02-20 MED ORDER — LACTATED RINGERS IV SOLN
INTRAVENOUS | Status: DC
  Administered 2017-02-20: 50 mL/h via INTRAVENOUS
  Administered 2017-02-20: 12:00:00 via INTRAVENOUS

## 2017-02-20 MED ORDER — DEXAMETHASONE SODIUM PHOSPHATE 10 MG/ML IJ SOLN: 10.0000 mg | INTRAMUSCULAR | Status: DC

## 2017-02-20 MED ORDER — MIDAZOLAM HCL 2 MG/2ML IJ SOLN
INTRAMUSCULAR | Status: AC
  Filled 2017-02-20: qty 2

## 2017-02-20 MED ORDER — ACETAMINOPHEN 650 MG RE SUPP: 650.0000 mg | RECTAL | Status: DC | PRN

## 2017-02-20 MED ORDER — LISINOPRIL 20 MG PO TABS
10.0000 mg | ORAL_TABLET | Freq: Every day | ORAL | Status: DC
  Administered 2017-02-20: 10 mg via ORAL
  Filled 2017-02-20: qty 1

## 2017-02-20 MED ORDER — FENTANYL CITRATE (PF) 250 MCG/5ML IJ SOLN
INTRAMUSCULAR | Status: DC | PRN
  Administered 2017-02-20 (×2): 100 ug via INTRAVENOUS
  Administered 2017-02-20: 50 ug via INTRAVENOUS

## 2017-02-20 MED ORDER — HYDROMORPHONE HCL 1 MG/ML IJ SOLN
0.2500 mg | INTRAMUSCULAR | Status: DC | PRN
  Administered 2017-02-20 (×4): 0.5 mg via INTRAVENOUS

## 2017-02-20 MED ORDER — 0.9 % SODIUM CHLORIDE (POUR BTL) OPTIME
TOPICAL | Status: DC | PRN
  Administered 2017-02-20: 1000 mL

## 2017-02-20 MED ORDER — CHLORHEXIDINE GLUCONATE CLOTH 2 % EX PADS: 6.0000 | MEDICATED_PAD | Freq: Once | CUTANEOUS | Status: DC

## 2017-02-20 MED ORDER — OXYCODONE-ACETAMINOPHEN 5-325 MG PO TABS
1.0000 | ORAL_TABLET | ORAL | Status: DC | PRN
  Administered 2017-02-20 – 2017-02-21 (×5): 2 via ORAL
  Filled 2017-02-20 (×5): qty 2

## 2017-02-20 MED ORDER — OXYCODONE HCL 5 MG PO TABS
ORAL_TABLET | ORAL | Status: AC
  Filled 2017-02-20: qty 1

## 2017-02-20 MED ORDER — OXYCODONE HCL 5 MG PO TABS: 5.0000 mg | ORAL_TABLET | ORAL | Status: DC | PRN

## 2017-02-20 MED ORDER — OXYCODONE HCL 5 MG PO TABS
5.0000 mg | ORAL_TABLET | Freq: Once | ORAL | Status: AC | PRN
  Administered 2017-02-20: 5 mg via ORAL

## 2017-02-20 MED ORDER — SODIUM CHLORIDE 0.9 % IR SOLN
Status: DC | PRN
  Administered 2017-02-20: 12:00:00

## 2017-02-20 MED ORDER — ONDANSETRON HCL 4 MG PO TABS: 4.0000 mg | ORAL_TABLET | Freq: Four times a day (QID) | ORAL | Status: DC | PRN

## 2017-02-20 MED ORDER — HYDROMORPHONE HCL 1 MG/ML IJ SOLN
INTRAMUSCULAR | Status: AC
  Filled 2017-02-20: qty 1

## 2017-02-20 MED ORDER — POTASSIUM CHLORIDE IN NACL 20-0.9 MEQ/L-% IV SOLN: INTRAVENOUS | Status: DC

## 2017-02-20 MED ORDER — METHOCARBAMOL 500 MG PO TABS
ORAL_TABLET | ORAL | Status: AC
  Filled 2017-02-20: qty 1

## 2017-02-20 MED ORDER — GELATIN ABSORBABLE MT POWD
OROMUCOSAL | Status: DC | PRN
  Administered 2017-02-20: 12:00:00 via TOPICAL

## 2017-02-20 MED ORDER — PHENOL 1.4 % MT LIQD
1.0000 | OROMUCOSAL | Status: DC | PRN
  Filled 2017-02-20: qty 177

## 2017-02-20 MED ORDER — SODIUM CHLORIDE 0.9% FLUSH
3.0000 mL | Freq: Two times a day (BID) | INTRAVENOUS | Status: DC
  Administered 2017-02-20 (×2): 3 mL via INTRAVENOUS

## 2017-02-20 MED ORDER — ROCURONIUM BROMIDE 100 MG/10ML IV SOLN
INTRAVENOUS | Status: DC | PRN
  Administered 2017-02-20: 50 mg via INTRAVENOUS
  Administered 2017-02-20: 10 mg via INTRAVENOUS

## 2017-02-20 MED ORDER — ONDANSETRON HCL 4 MG/2ML IJ SOLN: 4.0000 mg | Freq: Four times a day (QID) | INTRAMUSCULAR | Status: DC | PRN

## 2017-02-20 MED ORDER — LIDOCAINE HCL (CARDIAC) 20 MG/ML IV SOLN
INTRAVENOUS | Status: DC | PRN
  Administered 2017-02-20: 100 mg via INTRATRACHEAL

## 2017-02-20 MED ORDER — PHENYLEPHRINE 40 MCG/ML (10ML) SYRINGE FOR IV PUSH (FOR BLOOD PRESSURE SUPPORT)
PREFILLED_SYRINGE | INTRAVENOUS | Status: AC
  Filled 2017-02-20: qty 10

## 2017-02-20 MED ORDER — ESMOLOL HCL 100 MG/10ML IV SOLN
INTRAVENOUS | Status: AC
  Filled 2017-02-20: qty 10

## 2017-02-20 MED ORDER — PROMETHAZINE HCL 25 MG/ML IJ SOLN: 6.2500 mg | INTRAMUSCULAR | Status: DC | PRN

## 2017-02-20 MED ORDER — THROMBIN 5000 UNITS EX SOLR
CUTANEOUS | Status: DC | PRN
  Administered 2017-02-20 (×2): 5000 [IU] via TOPICAL

## 2017-02-20 MED ORDER — SENNA 8.6 MG PO TABS
1.0000 | ORAL_TABLET | Freq: Two times a day (BID) | ORAL | Status: DC
  Administered 2017-02-20 (×2): 8.6 mg via ORAL
  Filled 2017-02-20 (×2): qty 1

## 2017-02-20 MED ORDER — ONDANSETRON HCL 4 MG/2ML IJ SOLN
INTRAMUSCULAR | Status: DC | PRN
  Administered 2017-02-20: 4 mg via INTRAVENOUS

## 2017-02-20 MED ORDER — FENTANYL CITRATE (PF) 250 MCG/5ML IJ SOLN
INTRAMUSCULAR | Status: AC
  Filled 2017-02-20: qty 5

## 2017-02-20 MED ORDER — METHOCARBAMOL 1000 MG/10ML IJ SOLN
500.0000 mg | Freq: Four times a day (QID) | INTRAVENOUS | Status: DC | PRN
  Filled 2017-02-20: qty 5

## 2017-02-20 MED ORDER — PHENYLEPHRINE HCL 10 MG/ML IJ SOLN
INTRAMUSCULAR | Status: DC | PRN
  Administered 2017-02-20 (×2): 80 ug via INTRAVENOUS

## 2017-02-20 MED ORDER — CEFAZOLIN SODIUM-DEXTROSE 2-3 GM-% IV SOLR
INTRAVENOUS | Status: DC | PRN
  Administered 2017-02-20: 2 g via INTRAVENOUS

## 2017-02-20 MED ORDER — THROMBIN 5000 UNITS EX SOLR
CUTANEOUS | Status: AC
  Filled 2017-02-20: qty 15000

## 2017-02-20 MED ORDER — SODIUM CHLORIDE 0.9% FLUSH: 3.0000 mL | INTRAVENOUS | Status: DC | PRN

## 2017-02-20 MED ORDER — PROPOFOL 10 MG/ML IV BOLUS
INTRAVENOUS | Status: DC | PRN
  Administered 2017-02-20: 200 mg via INTRAVENOUS

## 2017-02-20 MED ORDER — MENTHOL 3 MG MT LOZG: 1.0000 | LOZENGE | OROMUCOSAL | Status: DC | PRN

## 2017-02-20 MED ORDER — DEXAMETHASONE SODIUM PHOSPHATE 10 MG/ML IJ SOLN
INTRAMUSCULAR | Status: AC
  Filled 2017-02-20: qty 1

## 2017-02-20 MED ORDER — ESMOLOL HCL 100 MG/10ML IV SOLN
INTRAVENOUS | Status: DC | PRN
  Administered 2017-02-20: 20 mg via INTRAVENOUS

## 2017-02-20 MED ORDER — BUPIVACAINE HCL (PF) 0.5 % IJ SOLN
INTRAMUSCULAR | Status: DC | PRN
  Administered 2017-02-20: 4 mL

## 2017-02-20 MED ORDER — CEFAZOLIN SODIUM-DEXTROSE 2-4 GM/100ML-% IV SOLN: 2.0000 g | INTRAVENOUS | Status: DC

## 2017-02-20 MED ORDER — HEMOSTATIC AGENTS (NO CHARGE) OPTIME
TOPICAL | Status: DC | PRN
  Administered 2017-02-20: 1 via TOPICAL

## 2017-02-20 MED ORDER — SUGAMMADEX SODIUM 200 MG/2ML IV SOLN
INTRAVENOUS | Status: DC | PRN
  Administered 2017-02-20: 200 mg via INTRAVENOUS

## 2017-02-20 MED ORDER — MORPHINE SULFATE (PF) 4 MG/ML IV SOLN
2.0000 mg | INTRAVENOUS | Status: DC | PRN
  Administered 2017-02-20: 2 mg via INTRAVENOUS
  Filled 2017-02-20: qty 1

## 2017-02-20 MED ORDER — ROCURONIUM BROMIDE 10 MG/ML (PF) SYRINGE
PREFILLED_SYRINGE | INTRAVENOUS | Status: AC
  Filled 2017-02-20: qty 10

## 2017-02-20 MED ORDER — METHOCARBAMOL 500 MG PO TABS
500.0000 mg | ORAL_TABLET | Freq: Four times a day (QID) | ORAL | Status: DC | PRN
  Administered 2017-02-20 – 2017-02-21 (×3): 500 mg via ORAL
  Filled 2017-02-20 (×2): qty 1

## 2017-02-20 MED ORDER — PROPOFOL 10 MG/ML IV BOLUS
INTRAVENOUS | Status: AC
  Filled 2017-02-20: qty 20

## 2017-02-20 MED ORDER — BUPIVACAINE HCL (PF) 0.5 % IJ SOLN
INTRAMUSCULAR | Status: AC
  Filled 2017-02-20: qty 30

## 2017-02-20 MED ORDER — CEFAZOLIN SODIUM-DEXTROSE 2-4 GM/100ML-% IV SOLN
INTRAVENOUS | Status: AC
  Filled 2017-02-20: qty 100

## 2017-02-20 MED ORDER — ONDANSETRON HCL 4 MG/2ML IJ SOLN
INTRAMUSCULAR | Status: AC
  Filled 2017-02-20: qty 4

## 2017-02-20 MED ORDER — LIDOCAINE 2% (20 MG/ML) 5 ML SYRINGE
INTRAMUSCULAR | Status: AC
  Filled 2017-02-20: qty 10

## 2017-02-20 MED ORDER — SUGAMMADEX SODIUM 200 MG/2ML IV SOLN
INTRAVENOUS | Status: AC
  Filled 2017-02-20: qty 4

## 2017-02-20 MED ORDER — MIDAZOLAM HCL 5 MG/5ML IJ SOLN
INTRAMUSCULAR | Status: DC | PRN
  Administered 2017-02-20: 2 mg via INTRAVENOUS

## 2017-02-20 MED ORDER — ACETAMINOPHEN 325 MG PO TABS: 650.0000 mg | ORAL_TABLET | ORAL | Status: DC | PRN

## 2017-02-20 MED ORDER — CEFAZOLIN SODIUM-DEXTROSE 2-4 GM/100ML-% IV SOLN
2.0000 g | Freq: Three times a day (TID) | INTRAVENOUS | Status: AC
  Administered 2017-02-20 (×2): 2 g via INTRAVENOUS
  Filled 2017-02-20 (×2): qty 100

## 2017-02-20 MED ORDER — MEPERIDINE HCL 25 MG/ML IJ SOLN: 6.2500 mg | INTRAMUSCULAR | Status: DC | PRN

## 2017-02-20 MED ORDER — OXYCODONE HCL 5 MG/5ML PO SOLN: 5.0000 mg | Freq: Once | ORAL | Status: AC | PRN

## 2017-02-20 MED ORDER — ARTIFICIAL TEARS OPHTHALMIC OINT
TOPICAL_OINTMENT | OPHTHALMIC | Status: AC
  Filled 2017-02-20: qty 3.5

## 2017-02-20 SURGICAL SUPPLY — 54 items
BAG DECANTER FOR FLEXI CONT (MISCELLANEOUS) ×2 IMPLANT
BASKET BONE COLLECTION (BASKET) ×2 IMPLANT
BENZOIN TINCTURE PRP APPL 2/3 (GAUZE/BANDAGES/DRESSINGS) ×2 IMPLANT
BIT DRILL 2.3 12 FIXED (INSTRUMENTS) ×1 IMPLANT
BUR MATCHSTICK NEURO 3.0 LAGG (BURR) ×2 IMPLANT
CAGE C-PTI MED 6 7D (Cage) ×2 IMPLANT
CANISTER SUCT 3000ML PPV (MISCELLANEOUS) ×2 IMPLANT
CARTRIDGE OIL MAESTRO DRILL (MISCELLANEOUS) ×1 IMPLANT
DIFFUSER DRILL AIR PNEUMATIC (MISCELLANEOUS) ×2 IMPLANT
DRAPE C-ARM 42X72 X-RAY (DRAPES) ×4 IMPLANT
DRAPE LAPAROTOMY 100X72 PEDS (DRAPES) ×2 IMPLANT
DRAPE MICROSCOPE LEICA (MISCELLANEOUS) ×2 IMPLANT
DRAPE POUCH INSTRU U-SHP 10X18 (DRAPES) ×2 IMPLANT
DRILL 12MM (INSTRUMENTS) ×2
DRSG OPSITE POSTOP 3X4 (GAUZE/BANDAGES/DRESSINGS) ×2 IMPLANT
DURAPREP 6ML APPLICATOR 50/CS (WOUND CARE) ×2 IMPLANT
ELECT COATED BLADE 2.86 ST (ELECTRODE) ×2 IMPLANT
ELECT REM PT RETURN 9FT ADLT (ELECTROSURGICAL) ×2
ELECTRODE REM PT RTRN 9FT ADLT (ELECTROSURGICAL) ×1 IMPLANT
GAUZE SPONGE 4X4 16PLY XRAY LF (GAUZE/BANDAGES/DRESSINGS) IMPLANT
GLOVE BIO SURGEON STRL SZ7 (GLOVE) IMPLANT
GLOVE BIO SURGEON STRL SZ8 (GLOVE) ×2 IMPLANT
GLOVE BIOGEL PI IND STRL 7.0 (GLOVE) ×1 IMPLANT
GLOVE BIOGEL PI IND STRL 7.5 (GLOVE) ×1 IMPLANT
GLOVE BIOGEL PI IND STRL 8 (GLOVE) ×1 IMPLANT
GLOVE BIOGEL PI INDICATOR 7.0 (GLOVE) ×1
GLOVE BIOGEL PI INDICATOR 7.5 (GLOVE) ×1
GLOVE BIOGEL PI INDICATOR 8 (GLOVE) ×1
GLOVE ECLIPSE 9.0 STRL (GLOVE) ×2 IMPLANT
GOWN STRL REUS W/ TWL LRG LVL3 (GOWN DISPOSABLE) ×2 IMPLANT
GOWN STRL REUS W/ TWL XL LVL3 (GOWN DISPOSABLE) ×2 IMPLANT
GOWN STRL REUS W/TWL 2XL LVL3 (GOWN DISPOSABLE) IMPLANT
GOWN STRL REUS W/TWL LRG LVL3 (GOWN DISPOSABLE) ×2
GOWN STRL REUS W/TWL XL LVL3 (GOWN DISPOSABLE) ×2
HEMOSTAT POWDER KIT SURGIFOAM (HEMOSTASIS) ×2 IMPLANT
KIT BASIN OR (CUSTOM PROCEDURE TRAY) ×2 IMPLANT
KIT ROOM TURNOVER OR (KITS) ×2 IMPLANT
NEEDLE HYPO 25X1 1.5 SAFETY (NEEDLE) ×2 IMPLANT
NEEDLE SPNL 20GX3.5 QUINCKE YW (NEEDLE) ×2 IMPLANT
NS IRRIG 1000ML POUR BTL (IV SOLUTION) ×2 IMPLANT
OIL CARTRIDGE MAESTRO DRILL (MISCELLANEOUS) ×2
PACK LAMINECTOMY NEURO (CUSTOM PROCEDURE TRAY) ×2 IMPLANT
PAD ARMBOARD 7.5X6 YLW CONV (MISCELLANEOUS) ×2 IMPLANT
PIN DISTRACTION 14MM (PIN) ×4 IMPLANT
PLATE 14MM (Plate) ×2 IMPLANT
RUBBERBAND STERILE (MISCELLANEOUS) ×4 IMPLANT
SCREW 14MM (Screw) ×8 IMPLANT
SPONGE INTESTINAL PEANUT (DISPOSABLE) ×2 IMPLANT
SPONGE SURGIFOAM ABS GEL SZ50 (HEMOSTASIS) ×2 IMPLANT
STRIP CLOSURE SKIN 1/2X4 (GAUZE/BANDAGES/DRESSINGS) ×2 IMPLANT
SUT VIC AB 3-0 SH 8-18 (SUTURE) ×2 IMPLANT
TOWEL GREEN STERILE (TOWEL DISPOSABLE) ×2 IMPLANT
TOWEL GREEN STERILE FF (TOWEL DISPOSABLE) ×2 IMPLANT
WATER STERILE IRR 1000ML POUR (IV SOLUTION) ×2 IMPLANT

## 2017-02-20 NOTE — Anesthesia Preprocedure Evaluation (Addendum)
Anesthesia Evaluation  Patient identified by MRN, date of birth, ID band Patient awake    Reviewed: Allergy & Precautions, NPO status , Patient's Chart, lab work & pertinent test results  Airway Mallampati: II  TM Distance: >3 FB Neck ROM: Full    Dental no notable dental hx. (+) Missing, Loose, Partial Upper, Partial Lower   Pulmonary neg pulmonary ROS, sleep apnea , Current Smoker,    Pulmonary exam normal breath sounds clear to auscultation       Cardiovascular hypertension, Pt. on medications + CAD  negative cardio ROS Normal cardiovascular exam Rhythm:Regular Rate:Normal     Neuro/Psych negative neurological ROS  negative psych ROS   GI/Hepatic negative GI ROS, Neg liver ROS,   Endo/Other  negative endocrine ROSdiabetes  Renal/GU negative Renal ROS  negative genitourinary   Musculoskeletal negative musculoskeletal ROS (+)   Abdominal   Peds negative pediatric ROS (+)  Hematology negative hematology ROS (+)   Anesthesia Other Findings   Reproductive/Obstetrics negative OB ROS                            Anesthesia Physical Anesthesia Plan  ASA: III  Anesthesia Plan: General   Post-op Pain Management:    Induction: Intravenous  PONV Risk Score and Plan: 1 and Ondansetron, Midazolam and Treatment may vary due to age or medical condition  Airway Management Planned: Oral ETT  Additional Equipment:   Intra-op Plan:   Post-operative Plan: Extubation in OR  Informed Consent: I have reviewed the patients History and Physical, chart, labs and discussed the procedure including the risks, benefits and alternatives for the proposed anesthesia with the patient or authorized representative who has indicated his/her understanding and acceptance.   Dental advisory given  Plan Discussed with: CRNA  Anesthesia Plan Comments:         Anesthesia Quick Evaluation

## 2017-02-20 NOTE — Anesthesia Postprocedure Evaluation (Signed)
Anesthesia Post Note  Patient: Jordan Lambert  Procedure(s) Performed: Procedure(s) (LRB): CERVICAL THREE-FOUR ANTERIOR CERVICAL DECOMPRESSION/DISCECTOMY FUSION (N/A)     Patient location during evaluation: PACU Anesthesia Type: General Level of consciousness: awake and alert Pain management: pain level controlled Vital Signs Assessment: post-procedure vital signs reviewed and stable Respiratory status: spontaneous breathing, nonlabored ventilation and respiratory function stable Cardiovascular status: blood pressure returned to baseline and stable Postop Assessment: no apparent nausea or vomiting Anesthetic complications: no    Last Vitals:  Vitals:   02/20/17 1330 02/20/17 1345  BP: (!) 168/91 (!) 166/91  Pulse: 82 77  Resp: 11 10  Temp:    SpO2: 96% 96%    Last Pain:  Vitals:   02/20/17 1345  TempSrc:   PainSc: [place]

## 2017-02-20 NOTE — Anesthesia Procedure Notes (Signed)
Procedure Name: Intubation Date/Time: 02/20/2017 11:29 AM Performed by: Mariea Clonts Pre-anesthesia Checklist: Patient identified, Emergency Drugs available, Suction available and Patient being monitored Patient Re-evaluated:Patient Re-evaluated prior to induction Oxygen Delivery Method: Circle System Utilized Preoxygenation: Pre-oxygenation with 100% oxygen Induction Type: IV induction Ventilation: Mask ventilation without difficulty Laryngoscope Size: Miller and 2 Grade View: Grade I Tube type: Oral Tube size: 7.5 mm Number of attempts: 1 Airway Equipment and Method: Stylet and Oral airway Placement Confirmation: ETT inserted through vocal cords under direct vision,  positive ETCO2 and breath sounds checked- equal and bilateral Tube secured with: Tape Dental Injury: Teeth and Oropharynx as per pre-operative assessment

## 2017-02-20 NOTE — Op Note (Signed)
02/20/2017  12:54 PM  PATIENT:  Jordan Lambert  61 y.o. 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 Decompressive anterior cervical discectomy C3-4, 2. Anterior cervical arthrodesis C3-4 utilizing a porous titanium interbody cage packed with locally harvested morcellized autologous bone graft, 3. Anterior cervical plating C3-4 utilizing a ATEC plate  SURGEON:  Sherley Bounds, MD  ASSISTANTS: Dr. Annette Stable  ANESTHESIA:   General  EBL: 20 ml  Total I/O In: 1000 [I.V.:1000] Out: 20 [Blood:20]  BLOOD ADMINISTERED: none  DRAINS: none  SPECIMEN:  none  INDICATION FOR PROCEDURE: This patient presented with neck pain and arm pain. Imaging showed stenosis C3-4 adjacent to previous ACDF. The patient tried conservative measures without relief. Pain was debilitating. Recommended ACDF with plating. Patient understood the risks, benefits, and alternatives and potential outcomes and wished to proceed.  PROCEDURE DETAILS: Patient was brought to the operating room placed under general endotracheal anesthesia. Patient was placed in the supine position on the operating room table. The neck was prepped with Duraprep and draped in a sterile fashion.   Three cc of local anesthesia was injected and a transverse incision was made on the right side of the neck.  Dissection was carried down thru the subcutaneous tissue and the platysma was  elevated, opened, and undermined with Metzenbaum scissors.  Dissection was then carried out thru an avascular plane leaving the sternocleidomastoid carotid artery and jugular vein laterally and the trachea and esophagus medially. The ventral aspect of the vertebral column was identified and a localizing x-ray was taken. The C3-4 level was identified. The longus colli muscles were then elevated and the retractor was placed. The annulus was incised and the disc space entered. Discectomy was performed with micro-curettes and  pituitary rongeurs. I then used the high-speed drill to drill the endplates down to the level of the posterior longitudinal ligament. The drill shavings were saved in a mucous trap for later arthrodesis. The operating microscope was draped and brought into the field provided additional magnification, illumination and visualization. Discectomy was continued posteriorly thru the disc space. Posterior longitudinal ligament was opened with a nerve hook, and then removed along with disc herniation and osteophytes, decompressing the spinal canal and thecal sac. We then continued to remove osteophytic overgrowth and disc material decompressing the neural foramina and exiting nerve roots bilaterally. The scope was angled up and down to help decompress and undercut the vertebral bodies. Once the decompression was completed we could pass a nerve hook circumferentially to assure adequate decompression in the midline and in the neural foramina. So by both visualization and palpation we felt we had an adequate decompression of the neural elements. We then measured the height of the intravertebral disc space and selected a 8 millimeter porous titanium interbody cage packed with autograft. It was then gently positioned in the intravertebral disc space and countersunk. I then used a 13 mm Alphatec plate and placed variable angle screws into the vertebral bodies of each level and locked them into position. The wound was irrigated with bacitracin solution, checked for hemostasis which was established and confirmed. Once meticulous hemostasis was achieved, we then proceeded with closure. The platysma was closed with interrupted 3-0 undyed Vicryl suture, the subcuticular layer was closed with interrupted 3-0 undyed Vicryl suture. The skin edges were approximated with steristrips. The drapes were removed. A sterile dressing was applied. The patient was then awakened from general anesthesia and transferred to the recovery room in stable  condition. At the end of the procedure all sponge, needle and instrument counts were correct.   PLAN OF CARE: Admit for overnight observation  PATIENT DISPOSITION:  PACU - hemodynamically stable.   Delay start of Pharmacological VTE agent (>24hrs) due to surgical blood loss or risk of bleeding:  yes

## 2017-02-20 NOTE — Transfer of Care (Signed)
Immediate Anesthesia Transfer of Care Note  Patient: Jordan Lambert  Procedure(s) Performed: Procedure(s): CERVICAL THREE-FOUR ANTERIOR CERVICAL DECOMPRESSION/DISCECTOMY FUSION (N/A)  Patient Location: PACU  Anesthesia Type:General  Level of Consciousness: awake, alert  and oriented  Airway & Oxygen Therapy: Patient Spontanous Breathing and Patient connected to nasal cannula oxygen  Post-op Assessment: Report given to RN, Post -op Vital signs reviewed and stable and Patient moving all extremities X 4  Post vital signs: Reviewed and stable  Last Vitals:  Vitals:   02/20/17 0923 02/20/17 1300  BP: (!) 146/73 (!) 164/98  Pulse: 66 96  Resp: 20 20  Temp: 36.8 C (!) 36.3 C  SpO2: 98% 98%    Last Pain:  Vitals:   02/20/17 1300  TempSrc:   PainSc: (P) 10-Worst pain ever      Patients Stated Pain Goal: 2 (24/49/75 3005)  Complications: No apparent anesthesia complications

## 2017-02-20 NOTE — H&P (Signed)
Subjective:   Patient is a 61 y.o. male admitted for Neck and arm pain. The patient first presented to me with complaints of neck pain, shooting pains in the arm(s) and numbness of the arm(s). Onset of symptoms was several months ago. The pain is described as aching and occurs all day. The pain is rated severe, unremitting, and is located in the neck and radiates to the arms. The symptoms have been progressive. Symptoms are exacerbated by extending head backwards, and are relieved by none.  Previous work up includes MRI of cervical spine, results: spinal stenosis.  Past Medical History:  Diagnosis Date  . Arthritis   . Diabetes mellitus without complication (HCC)    not on meds  . Hypertension    has been taken off bp meds   . 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 Sleep apnea    does not use cpap    Past Surgical History:  Procedure Laterality Date  . APPENDECTOMY    . BACK SURGERY    . HERNIA REPAIR    . neck fusion    . nerve stimulor placed    . sciatica      No Known Allergies  Social History  Substance Use Topics  . Smoking status: Current Every Day Smoker    Packs/day: 1.00    Types: Cigarettes  . Smokeless tobacco: Never Used  . Alcohol use Yes     Comment: occasionally    History reviewed. No pertinent family history. Prior to Admission medications   Medication Sig Start Date End Date Taking? Authorizing Provider  lisinopril (PRINIVIL,ZESTRIL) 10 MG tablet Take 10 mg by mouth daily. 01/20/17  Yes [provider]  oxyCODONE-acetaminophen (PERCOCET/ROXICET) 5-325 MG tablet Take 1 tablet by mouth every 6 (six) hours. 02/02/17  Yes [provider]     Review of Systems  Positive ROS: neg  All other systems have been reviewed and were otherwise negative with the exception of those mentioned in the HPI and as above.  Objective: Vital signs in last 24 hours: Temp:  [98.2 F (36.8 C)] 98.2 F (36.8 C) (09/14 0923) Pulse Rate:  [66]  66 (09/14 0923) Resp:  [20] 20 (09/14 0923) BP: (146)/(73) 146/73 (09/14 0923) SpO2:  [98 %] 98 % (09/14 0923)  General Appearance: Alert, cooperative, no distress, appears stated age Head: Normocephalic, without obvious abnormality, atraumatic Eyes: PERRL, conjunctiva/corneas clear, EOM's intact      Neck: Supple, symmetrical, trachea midline, Back: Symmetric, no curvature, ROM normal, no CVA tenderness Lungs:  respirations unlabored Heart: Regular rate and rhythm Abdomen: Soft, non-tender Extremities: Extremities normal, atraumatic, no cyanosis or edema Pulses: 2+ and symmetric all extremities Skin: Skin color, texture, turgor normal, no rashes or lesions  NEUROLOGIC:  Mental status: Alert and oriented x4, no aphasia, good attention span, fund of knowledge and memory  Motor Exam - grossly normal Sensory Exam - grossly normal Reflexes: 1+ Coordination - grossly normal Gait - grossly normal Balance - grossly normal Cranial Nerves: I: smell Not tested  II: visual acuity  OS: nl    OD: nl  II: visual fields Full to confrontation  II: pupils Equal, round, reactive to light  III,VII: ptosis None  III,IV,VI: extraocular muscles  Full ROM  V: mastication Normal  V: facial light touch sensation  Normal  V,VII: corneal reflex  Present  VII: facial muscle function - upper  Normal  VII: facial muscle function - lower Normal  VIII:  hearing Not tested  IX: soft palate elevation  Normal  IX,X: gag reflex Present  XI: trapezius strength  5/5  XI: sternocleidomastoid strength 5/5  XI: neck flexion strength  5/5  XII: tongue strength  Normal    Data Review Lab Results  Component Value Date   WBC 12.5 (H) 01/21/2017   HGB 16.9 01/21/2017   HCT 48.6 01/21/2017   MCV 89.0 01/21/2017   PLT 239 01/21/2017   Lab Results  Component Value Date   NA 138 01/21/2017   K 4.0 01/21/2017   CL 105 01/21/2017   CO2 24 01/21/2017   BUN <5 (L) 01/21/2017   CREATININE 0.90 01/21/2017    GLUCOSE 117 (H) 01/21/2017   Lab Results  Component Value Date   INR 0.97 01/21/2017    Assessment:   Cervical neck pain with herniated nucleus pulposus/ spondylosis/ stenosis at C3-4. Patient has failed conservative therapy. Planned surgery : ACDF and plate C3-4  Plan:   I explained the condition and procedure to the patient and answered any questions.  Patient wishes to proceed with procedure as planned. Understands risks/ benefits/ and expected or typical outcomes.  [name],[name] 02/20/2017 9:31 AM

## 2017-02-21 DIAGNOSIS — I10 Essential (primary) hypertension: Secondary | ICD-10-CM | POA: Diagnosis not present

## 2017-02-21 DIAGNOSIS — M4712 Other spondylosis with myelopathy, cervical region: Secondary | ICD-10-CM | POA: Diagnosis not present

## 2017-02-21 DIAGNOSIS — M4802 Spinal stenosis, cervical region: Secondary | ICD-10-CM | POA: Diagnosis not present

## 2017-02-21 DIAGNOSIS — G4730 Sleep apnea, unspecified: Secondary | ICD-10-CM | POA: Diagnosis not present

## 2017-02-21 DIAGNOSIS — M1990 Unspecified osteoarthritis, unspecified site: Secondary | ICD-10-CM | POA: Diagnosis not present

## 2017-02-21 DIAGNOSIS — E118 Type 2 diabetes mellitus with unspecified complications: Secondary | ICD-10-CM | POA: Diagnosis not present

## 2017-02-21 LAB — GLUCOSE, CAPILLARY: GLUCOSE-CAPILLARY: 101 mg/dL — AB (ref 65–99)

## 2017-02-21 MED ORDER — METHOCARBAMOL 500 MG PO TABS: 500.0000 mg | ORAL_TABLET | Freq: Four times a day (QID) | ORAL | 3 refills | Status: DC | PRN

## 2017-02-21 MED ORDER — OXYCODONE-ACETAMINOPHEN 5-325 MG PO TABS: 1.0000 | ORAL_TABLET | ORAL | 0 refills | Status: DC | PRN

## 2017-02-21 NOTE — Discharge Summary (Signed)
Discharge summary: Date of admission: 02/20/2017 Date of discharge: 02/21/2017 Condition on discharge: Improved Admitting diagnosis: Cervical spondylosis with myelopathy C3-C4 Discharge and final diagnosis: Cervical spondylosis with myelopathy C3-C4 Hospital course: Patient was admitted to undergo anterior decompression arthrodesis which he tolerated well. He is discharged home.  Discharge medications: Percocet 5/325 #40 without refills. Robaxin 500 mg #40 with 3 refills.

## 2017-02-21 NOTE — Discharge Instructions (Signed)
Wound Care °Keep incision covered and dry for 3 days    °You may remove outer bandage after 3 days. °Do not put any creams, lotions, or ointments on incision. °Leave steri-strips on neck.  They will fall off by themselves. °Activity °Walk each and every day, increasing distance each day. °No lifting greater than 8 lbs.  Avoid excessive neck motion. °No driving for 2 weeks; may ride as a passenger locally. ° °Diet °Resume your normal soft diet.  °Return to Work °Will be discussed at you follow up appointment. °Call Your Doctor If Any of These Occur °Redness, drainage, or swelling at the wound.  °Temperature greater than 101 degrees. °Severe pain not relieved by pain medication. °Increased difficulty swallowing.  °Incision starts to come apart. °Follow Up Appt °Call today for appointment in 1-2 weeks ([phone]) or for problems.  If you have any hardware placed in your spine, you will need an x-ray before your appointment. ° °

## 2017-02-23 ENCOUNTER — Encounter (HOSPITAL_COMMUNITY): Payer: Self-pay | Admitting: Neurological Surgery

## 2017-02-27 ENCOUNTER — Telehealth: Payer: Self-pay | Admitting: *Deleted

## 2017-02-27 NOTE — Telephone Encounter (Signed)
-----   Message from Arnoldo Lenis, MD sent at 02/27/2017 12:59 PM EDT ----- F/u 6 months  JB ----- Message ----- From: Massie Maroon, CMA Sent: 02/26/2017   4:28 PM To: Arnoldo Lenis, MD  When did you want to f/u with this pt?  Marten Iles

## 2017-02-27 NOTE — Telephone Encounter (Signed)
Recall placed

## 2017-03-05 DIAGNOSIS — M4802 Spinal stenosis, cervical region: Secondary | ICD-10-CM | POA: Diagnosis not present

## 2017-03-26 DIAGNOSIS — S301XXA Contusion of abdominal wall, initial encounter: Secondary | ICD-10-CM | POA: Diagnosis not present

## 2017-03-26 DIAGNOSIS — I10 Essential (primary) hypertension: Secondary | ICD-10-CM | POA: Diagnosis not present

## 2017-03-26 DIAGNOSIS — Z79899 Other long term (current) drug therapy: Secondary | ICD-10-CM | POA: Diagnosis not present

## 2017-03-26 DIAGNOSIS — R0781 Pleurodynia: Secondary | ICD-10-CM | POA: Diagnosis not present

## 2017-03-26 DIAGNOSIS — S20211A Contusion of right front wall of thorax, initial encounter: Secondary | ICD-10-CM | POA: Diagnosis not present

## 2017-03-26 DIAGNOSIS — S20219A Contusion of unspecified front wall of thorax, initial encounter: Secondary | ICD-10-CM | POA: Diagnosis not present

## 2017-03-26 DIAGNOSIS — E119 Type 2 diabetes mellitus without complications: Secondary | ICD-10-CM | POA: Diagnosis not present

## 2017-04-21 DIAGNOSIS — Z6826 Body mass index (BMI) 26.0-26.9, adult: Secondary | ICD-10-CM | POA: Diagnosis not present

## 2017-04-21 DIAGNOSIS — I10 Essential (primary) hypertension: Secondary | ICD-10-CM | POA: Diagnosis not present

## 2017-04-21 DIAGNOSIS — F17210 Nicotine dependence, cigarettes, uncomplicated: Secondary | ICD-10-CM | POA: Diagnosis not present

## 2017-04-21 DIAGNOSIS — E1165 Type 2 diabetes mellitus with hyperglycemia: Secondary | ICD-10-CM | POA: Diagnosis not present

## 2017-04-21 DIAGNOSIS — Z299 Encounter for prophylactic measures, unspecified: Secondary | ICD-10-CM | POA: Diagnosis not present

## 2017-05-22 DIAGNOSIS — E7800 Pure hypercholesterolemia, unspecified: Secondary | ICD-10-CM | POA: Diagnosis not present

## 2017-05-22 DIAGNOSIS — Z7189 Other specified counseling: Secondary | ICD-10-CM | POA: Diagnosis not present

## 2017-05-22 DIAGNOSIS — Z125 Encounter for screening for malignant neoplasm of prostate: Secondary | ICD-10-CM | POA: Diagnosis not present

## 2017-05-22 DIAGNOSIS — Z6827 Body mass index (BMI) 27.0-27.9, adult: Secondary | ICD-10-CM | POA: Diagnosis not present

## 2017-05-22 DIAGNOSIS — Z299 Encounter for prophylactic measures, unspecified: Secondary | ICD-10-CM | POA: Diagnosis not present

## 2017-05-22 DIAGNOSIS — Z1211 Encounter for screening for malignant neoplasm of colon: Secondary | ICD-10-CM | POA: Diagnosis not present

## 2017-05-22 DIAGNOSIS — Z0000 Encounter for general adult medical examination without abnormal findings: Secondary | ICD-10-CM | POA: Diagnosis not present

## 2017-05-22 DIAGNOSIS — M542 Cervicalgia: Secondary | ICD-10-CM | POA: Diagnosis not present

## 2017-05-22 DIAGNOSIS — I10 Essential (primary) hypertension: Secondary | ICD-10-CM | POA: Diagnosis not present

## 2017-05-22 DIAGNOSIS — Z79899 Other long term (current) drug therapy: Secondary | ICD-10-CM | POA: Diagnosis not present

## 2017-05-22 DIAGNOSIS — Z1331 Encounter for screening for depression: Secondary | ICD-10-CM | POA: Diagnosis not present

## 2017-05-22 DIAGNOSIS — Z1339 Encounter for screening examination for other mental health and behavioral disorders: Secondary | ICD-10-CM | POA: Diagnosis not present

## 2017-06-15 DIAGNOSIS — M47812 Spondylosis without myelopathy or radiculopathy, cervical region: Secondary | ICD-10-CM | POA: Diagnosis not present

## 2017-06-15 DIAGNOSIS — M5136 Other intervertebral disc degeneration, lumbar region: Secondary | ICD-10-CM | POA: Diagnosis not present

## 2017-06-15 DIAGNOSIS — Z79891 Long term (current) use of opiate analgesic: Secondary | ICD-10-CM | POA: Diagnosis not present

## 2017-06-15 DIAGNOSIS — M545 Low back pain: Secondary | ICD-10-CM | POA: Diagnosis not present

## 2017-06-15 DIAGNOSIS — M47816 Spondylosis without myelopathy or radiculopathy, lumbar region: Secondary | ICD-10-CM | POA: Diagnosis not present

## 2017-06-15 DIAGNOSIS — G894 Chronic pain syndrome: Secondary | ICD-10-CM | POA: Diagnosis not present

## 2017-06-15 DIAGNOSIS — M47896 Other spondylosis, lumbar region: Secondary | ICD-10-CM | POA: Diagnosis not present

## 2017-07-15 DIAGNOSIS — G894 Chronic pain syndrome: Secondary | ICD-10-CM | POA: Diagnosis not present

## 2017-07-15 DIAGNOSIS — M545 Low back pain: Secondary | ICD-10-CM | POA: Diagnosis not present

## 2017-07-15 DIAGNOSIS — M47816 Spondylosis without myelopathy or radiculopathy, lumbar region: Secondary | ICD-10-CM | POA: Diagnosis not present

## 2017-07-15 DIAGNOSIS — M5136 Other intervertebral disc degeneration, lumbar region: Secondary | ICD-10-CM | POA: Diagnosis not present

## 2017-07-21 DIAGNOSIS — G8929 Other chronic pain: Secondary | ICD-10-CM | POA: Diagnosis not present

## 2017-07-21 DIAGNOSIS — M549 Dorsalgia, unspecified: Secondary | ICD-10-CM | POA: Diagnosis not present

## 2017-07-27 DIAGNOSIS — M47816 Spondylosis without myelopathy or radiculopathy, lumbar region: Secondary | ICD-10-CM | POA: Diagnosis not present

## 2017-07-27 DIAGNOSIS — M5136 Other intervertebral disc degeneration, lumbar region: Secondary | ICD-10-CM | POA: Diagnosis not present

## 2017-08-19 DIAGNOSIS — Z6826 Body mass index (BMI) 26.0-26.9, adult: Secondary | ICD-10-CM | POA: Diagnosis not present

## 2017-08-19 DIAGNOSIS — M79606 Pain in leg, unspecified: Secondary | ICD-10-CM | POA: Diagnosis not present

## 2017-08-24 DIAGNOSIS — M5136 Other intervertebral disc degeneration, lumbar region: Secondary | ICD-10-CM | POA: Diagnosis not present

## 2017-08-24 DIAGNOSIS — G894 Chronic pain syndrome: Secondary | ICD-10-CM | POA: Diagnosis not present

## 2017-08-24 DIAGNOSIS — M47816 Spondylosis without myelopathy or radiculopathy, lumbar region: Secondary | ICD-10-CM | POA: Diagnosis not present

## 2017-09-01 DIAGNOSIS — M549 Dorsalgia, unspecified: Secondary | ICD-10-CM | POA: Diagnosis not present

## 2017-09-01 DIAGNOSIS — M5416 Radiculopathy, lumbar region: Secondary | ICD-10-CM | POA: Diagnosis not present

## 2017-09-01 DIAGNOSIS — M47816 Spondylosis without myelopathy or radiculopathy, lumbar region: Secondary | ICD-10-CM | POA: Diagnosis not present

## 2017-09-07 DIAGNOSIS — Z299 Encounter for prophylactic measures, unspecified: Secondary | ICD-10-CM | POA: Diagnosis not present

## 2017-09-07 DIAGNOSIS — E1165 Type 2 diabetes mellitus with hyperglycemia: Secondary | ICD-10-CM | POA: Diagnosis not present

## 2017-09-07 DIAGNOSIS — E7800 Pure hypercholesterolemia, unspecified: Secondary | ICD-10-CM | POA: Diagnosis not present

## 2017-09-07 DIAGNOSIS — M549 Dorsalgia, unspecified: Secondary | ICD-10-CM | POA: Diagnosis not present

## 2017-09-07 DIAGNOSIS — I10 Essential (primary) hypertension: Secondary | ICD-10-CM | POA: Diagnosis not present

## 2017-09-07 DIAGNOSIS — R1032 Left lower quadrant pain: Secondary | ICD-10-CM | POA: Diagnosis not present

## 2017-09-24 DIAGNOSIS — M545 Low back pain: Secondary | ICD-10-CM | POA: Diagnosis not present

## 2017-09-24 DIAGNOSIS — G894 Chronic pain syndrome: Secondary | ICD-10-CM | POA: Diagnosis not present

## 2017-09-24 DIAGNOSIS — M47816 Spondylosis without myelopathy or radiculopathy, lumbar region: Secondary | ICD-10-CM | POA: Diagnosis not present

## 2017-09-24 DIAGNOSIS — M5136 Other intervertebral disc degeneration, lumbar region: Secondary | ICD-10-CM | POA: Diagnosis not present

## 2017-09-30 DIAGNOSIS — Z6827 Body mass index (BMI) 27.0-27.9, adult: Secondary | ICD-10-CM | POA: Diagnosis not present

## 2017-09-30 DIAGNOSIS — M79606 Pain in leg, unspecified: Secondary | ICD-10-CM | POA: Diagnosis not present

## 2017-11-06 DIAGNOSIS — M5416 Radiculopathy, lumbar region: Secondary | ICD-10-CM | POA: Diagnosis not present

## 2017-11-06 DIAGNOSIS — M7062 Trochanteric bursitis, left hip: Secondary | ICD-10-CM | POA: Diagnosis not present

## 2017-11-06 DIAGNOSIS — M5136 Other intervertebral disc degeneration, lumbar region: Secondary | ICD-10-CM | POA: Diagnosis not present

## 2017-11-12 DIAGNOSIS — I10 Essential (primary) hypertension: Secondary | ICD-10-CM | POA: Diagnosis not present

## 2017-11-12 DIAGNOSIS — M25569 Pain in unspecified knee: Secondary | ICD-10-CM | POA: Diagnosis not present

## 2017-11-12 DIAGNOSIS — Z299 Encounter for prophylactic measures, unspecified: Secondary | ICD-10-CM | POA: Diagnosis not present

## 2017-11-12 DIAGNOSIS — R109 Unspecified abdominal pain: Secondary | ICD-10-CM | POA: Diagnosis not present

## 2017-11-12 DIAGNOSIS — Z6828 Body mass index (BMI) 28.0-28.9, adult: Secondary | ICD-10-CM | POA: Diagnosis not present

## 2017-11-12 DIAGNOSIS — M549 Dorsalgia, unspecified: Secondary | ICD-10-CM | POA: Diagnosis not present

## 2017-11-12 DIAGNOSIS — E1165 Type 2 diabetes mellitus with hyperglycemia: Secondary | ICD-10-CM | POA: Diagnosis not present

## 2017-11-23 DIAGNOSIS — R3914 Feeling of incomplete bladder emptying: Secondary | ICD-10-CM | POA: Diagnosis not present

## 2017-11-23 DIAGNOSIS — N401 Enlarged prostate with lower urinary tract symptoms: Secondary | ICD-10-CM | POA: Diagnosis not present

## 2017-11-23 DIAGNOSIS — N35819 Other urethral stricture, male, unspecified site: Secondary | ICD-10-CM | POA: Diagnosis not present

## 2017-11-23 DIAGNOSIS — N528 Other male erectile dysfunction: Secondary | ICD-10-CM | POA: Diagnosis not present

## 2017-11-23 DIAGNOSIS — Z87438 Personal history of other diseases of male genital organs: Secondary | ICD-10-CM | POA: Diagnosis not present

## 2017-11-25 DIAGNOSIS — R1012 Left upper quadrant pain: Secondary | ICD-10-CM | POA: Diagnosis not present

## 2017-12-14 DIAGNOSIS — Z299 Encounter for prophylactic measures, unspecified: Secondary | ICD-10-CM | POA: Diagnosis not present

## 2017-12-14 DIAGNOSIS — E1165 Type 2 diabetes mellitus with hyperglycemia: Secondary | ICD-10-CM | POA: Diagnosis not present

## 2017-12-14 DIAGNOSIS — I10 Essential (primary) hypertension: Secondary | ICD-10-CM | POA: Diagnosis not present

## 2017-12-14 DIAGNOSIS — G4700 Insomnia, unspecified: Secondary | ICD-10-CM | POA: Diagnosis not present

## 2017-12-14 DIAGNOSIS — Z6827 Body mass index (BMI) 27.0-27.9, adult: Secondary | ICD-10-CM | POA: Diagnosis not present

## 2017-12-14 DIAGNOSIS — F17210 Nicotine dependence, cigarettes, uncomplicated: Secondary | ICD-10-CM | POA: Diagnosis not present

## 2017-12-14 DIAGNOSIS — T148XXA Other injury of unspecified body region, initial encounter: Secondary | ICD-10-CM | POA: Diagnosis not present

## 2017-12-24 DIAGNOSIS — M47816 Spondylosis without myelopathy or radiculopathy, lumbar region: Secondary | ICD-10-CM | POA: Diagnosis not present

## 2017-12-24 DIAGNOSIS — M545 Low back pain: Secondary | ICD-10-CM | POA: Diagnosis not present

## 2017-12-24 DIAGNOSIS — M7918 Myalgia, other site: Secondary | ICD-10-CM | POA: Diagnosis not present

## 2017-12-24 DIAGNOSIS — G894 Chronic pain syndrome: Secondary | ICD-10-CM | POA: Diagnosis not present

## 2017-12-24 DIAGNOSIS — M47896 Other spondylosis, lumbar region: Secondary | ICD-10-CM | POA: Diagnosis not present

## 2017-12-24 DIAGNOSIS — M5136 Other intervertebral disc degeneration, lumbar region: Secondary | ICD-10-CM | POA: Diagnosis not present

## 2017-12-31 DIAGNOSIS — I10 Essential (primary) hypertension: Secondary | ICD-10-CM | POA: Diagnosis not present

## 2017-12-31 DIAGNOSIS — G4730 Sleep apnea, unspecified: Secondary | ICD-10-CM | POA: Diagnosis not present

## 2017-12-31 DIAGNOSIS — D126 Benign neoplasm of colon, unspecified: Secondary | ICD-10-CM | POA: Diagnosis not present

## 2017-12-31 DIAGNOSIS — G629 Polyneuropathy, unspecified: Secondary | ICD-10-CM | POA: Diagnosis not present

## 2017-12-31 DIAGNOSIS — F329 Major depressive disorder, single episode, unspecified: Secondary | ICD-10-CM | POA: Diagnosis not present

## 2017-12-31 DIAGNOSIS — D122 Benign neoplasm of ascending colon: Secondary | ICD-10-CM | POA: Diagnosis not present

## 2017-12-31 DIAGNOSIS — F17200 Nicotine dependence, unspecified, uncomplicated: Secondary | ICD-10-CM | POA: Diagnosis not present

## 2017-12-31 DIAGNOSIS — K635 Polyp of colon: Secondary | ICD-10-CM | POA: Diagnosis not present

## 2017-12-31 DIAGNOSIS — Z981 Arthrodesis status: Secondary | ICD-10-CM | POA: Diagnosis not present

## 2017-12-31 DIAGNOSIS — K259 Gastric ulcer, unspecified as acute or chronic, without hemorrhage or perforation: Secondary | ICD-10-CM | POA: Diagnosis not present

## 2017-12-31 DIAGNOSIS — R109 Unspecified abdominal pain: Secondary | ICD-10-CM | POA: Diagnosis not present

## 2018-01-20 DIAGNOSIS — D126 Benign neoplasm of colon, unspecified: Secondary | ICD-10-CM | POA: Diagnosis not present

## 2018-01-20 DIAGNOSIS — R1012 Left upper quadrant pain: Secondary | ICD-10-CM | POA: Diagnosis not present

## 2018-01-21 DIAGNOSIS — M545 Low back pain: Secondary | ICD-10-CM | POA: Diagnosis not present

## 2018-01-21 DIAGNOSIS — M47816 Spondylosis without myelopathy or radiculopathy, lumbar region: Secondary | ICD-10-CM | POA: Diagnosis not present

## 2018-02-22 DIAGNOSIS — M1612 Unilateral primary osteoarthritis, left hip: Secondary | ICD-10-CM | POA: Diagnosis not present

## 2018-02-22 DIAGNOSIS — M5136 Other intervertebral disc degeneration, lumbar region: Secondary | ICD-10-CM | POA: Diagnosis not present

## 2018-02-22 DIAGNOSIS — M7062 Trochanteric bursitis, left hip: Secondary | ICD-10-CM | POA: Diagnosis not present

## 2018-02-25 DIAGNOSIS — M545 Low back pain: Secondary | ICD-10-CM | POA: Diagnosis not present

## 2018-02-25 DIAGNOSIS — M47816 Spondylosis without myelopathy or radiculopathy, lumbar region: Secondary | ICD-10-CM | POA: Diagnosis not present

## 2018-03-22 DIAGNOSIS — M47816 Spondylosis without myelopathy or radiculopathy, lumbar region: Secondary | ICD-10-CM | POA: Diagnosis not present

## 2018-03-25 DIAGNOSIS — F17210 Nicotine dependence, cigarettes, uncomplicated: Secondary | ICD-10-CM | POA: Diagnosis not present

## 2018-03-25 DIAGNOSIS — E1165 Type 2 diabetes mellitus with hyperglycemia: Secondary | ICD-10-CM | POA: Diagnosis not present

## 2018-03-25 DIAGNOSIS — Z23 Encounter for immunization: Secondary | ICD-10-CM | POA: Diagnosis not present

## 2018-03-25 DIAGNOSIS — Z6827 Body mass index (BMI) 27.0-27.9, adult: Secondary | ICD-10-CM | POA: Diagnosis not present

## 2018-03-25 DIAGNOSIS — I10 Essential (primary) hypertension: Secondary | ICD-10-CM | POA: Diagnosis not present

## 2018-03-25 DIAGNOSIS — M549 Dorsalgia, unspecified: Secondary | ICD-10-CM | POA: Diagnosis not present

## 2018-03-25 DIAGNOSIS — R2689 Other abnormalities of gait and mobility: Secondary | ICD-10-CM | POA: Diagnosis not present

## 2018-03-25 DIAGNOSIS — Z299 Encounter for prophylactic measures, unspecified: Secondary | ICD-10-CM | POA: Diagnosis not present

## 2018-03-25 DIAGNOSIS — R202 Paresthesia of skin: Secondary | ICD-10-CM | POA: Diagnosis not present

## 2018-03-30 DIAGNOSIS — H81393 Other peripheral vertigo, bilateral: Secondary | ICD-10-CM | POA: Diagnosis not present

## 2018-03-30 DIAGNOSIS — E1159 Type 2 diabetes mellitus with other circulatory complications: Secondary | ICD-10-CM | POA: Diagnosis not present

## 2018-03-30 DIAGNOSIS — M79605 Pain in left leg: Secondary | ICD-10-CM | POA: Diagnosis not present

## 2018-03-30 DIAGNOSIS — E1140 Type 2 diabetes mellitus with diabetic neuropathy, unspecified: Secondary | ICD-10-CM | POA: Diagnosis not present

## 2018-04-19 DIAGNOSIS — Z79891 Long term (current) use of opiate analgesic: Secondary | ICD-10-CM | POA: Diagnosis not present

## 2018-04-19 DIAGNOSIS — M545 Low back pain: Secondary | ICD-10-CM | POA: Diagnosis not present

## 2018-04-19 DIAGNOSIS — M7062 Trochanteric bursitis, left hip: Secondary | ICD-10-CM | POA: Diagnosis not present

## 2018-04-19 DIAGNOSIS — M47816 Spondylosis without myelopathy or radiculopathy, lumbar region: Secondary | ICD-10-CM | POA: Diagnosis not present

## 2018-04-22 DIAGNOSIS — R42 Dizziness and giddiness: Secondary | ICD-10-CM | POA: Diagnosis not present

## 2018-04-22 DIAGNOSIS — I10 Essential (primary) hypertension: Secondary | ICD-10-CM | POA: Diagnosis not present

## 2018-04-22 DIAGNOSIS — E1165 Type 2 diabetes mellitus with hyperglycemia: Secondary | ICD-10-CM | POA: Diagnosis not present

## 2018-04-22 DIAGNOSIS — Z299 Encounter for prophylactic measures, unspecified: Secondary | ICD-10-CM | POA: Diagnosis not present

## 2018-04-22 DIAGNOSIS — Z6827 Body mass index (BMI) 27.0-27.9, adult: Secondary | ICD-10-CM | POA: Diagnosis not present

## 2018-05-10 DIAGNOSIS — M1712 Unilateral primary osteoarthritis, left knee: Secondary | ICD-10-CM | POA: Diagnosis not present

## 2018-05-10 DIAGNOSIS — M7062 Trochanteric bursitis, left hip: Secondary | ICD-10-CM | POA: Diagnosis not present

## 2018-05-10 DIAGNOSIS — M25862 Other specified joint disorders, left knee: Secondary | ICD-10-CM | POA: Diagnosis not present

## 2018-05-14 DIAGNOSIS — H2513 Age-related nuclear cataract, bilateral: Secondary | ICD-10-CM | POA: Diagnosis not present

## 2018-05-14 DIAGNOSIS — E119 Type 2 diabetes mellitus without complications: Secondary | ICD-10-CM | POA: Diagnosis not present

## 2018-05-14 DIAGNOSIS — H02889 Meibomian gland dysfunction of unspecified eye, unspecified eyelid: Secondary | ICD-10-CM | POA: Diagnosis not present

## 2018-05-26 ENCOUNTER — Other Ambulatory Visit: Payer: Self-pay | Admitting: Anesthesiology

## 2018-05-26 DIAGNOSIS — M1712 Unilateral primary osteoarthritis, left knee: Secondary | ICD-10-CM

## 2018-05-28 DIAGNOSIS — Z125 Encounter for screening for malignant neoplasm of prostate: Secondary | ICD-10-CM | POA: Diagnosis not present

## 2018-05-28 DIAGNOSIS — Z79899 Other long term (current) drug therapy: Secondary | ICD-10-CM | POA: Diagnosis not present

## 2018-05-28 DIAGNOSIS — Z1331 Encounter for screening for depression: Secondary | ICD-10-CM | POA: Diagnosis not present

## 2018-05-28 DIAGNOSIS — F17210 Nicotine dependence, cigarettes, uncomplicated: Secondary | ICD-10-CM | POA: Diagnosis not present

## 2018-05-28 DIAGNOSIS — E7800 Pure hypercholesterolemia, unspecified: Secondary | ICD-10-CM | POA: Diagnosis not present

## 2018-05-28 DIAGNOSIS — Z6826 Body mass index (BMI) 26.0-26.9, adult: Secondary | ICD-10-CM | POA: Diagnosis not present

## 2018-05-28 DIAGNOSIS — Z7189 Other specified counseling: Secondary | ICD-10-CM | POA: Diagnosis not present

## 2018-05-28 DIAGNOSIS — Z1339 Encounter for screening examination for other mental health and behavioral disorders: Secondary | ICD-10-CM | POA: Diagnosis not present

## 2018-05-28 DIAGNOSIS — I10 Essential (primary) hypertension: Secondary | ICD-10-CM | POA: Diagnosis not present

## 2018-05-28 DIAGNOSIS — R5383 Other fatigue: Secondary | ICD-10-CM | POA: Diagnosis not present

## 2018-05-28 DIAGNOSIS — Z0000 Encounter for general adult medical examination without abnormal findings: Secondary | ICD-10-CM | POA: Diagnosis not present

## 2018-05-28 DIAGNOSIS — Z299 Encounter for prophylactic measures, unspecified: Secondary | ICD-10-CM | POA: Diagnosis not present

## 2018-06-07 DIAGNOSIS — M47816 Spondylosis without myelopathy or radiculopathy, lumbar region: Secondary | ICD-10-CM | POA: Diagnosis not present

## 2018-06-07 DIAGNOSIS — G894 Chronic pain syndrome: Secondary | ICD-10-CM | POA: Diagnosis not present

## 2018-06-07 DIAGNOSIS — M25562 Pain in left knee: Secondary | ICD-10-CM | POA: Diagnosis not present

## 2018-06-07 DIAGNOSIS — M545 Low back pain: Secondary | ICD-10-CM | POA: Diagnosis not present

## 2018-06-07 DIAGNOSIS — G8929 Other chronic pain: Secondary | ICD-10-CM | POA: Diagnosis not present

## 2018-07-02 DIAGNOSIS — Z6826 Body mass index (BMI) 26.0-26.9, adult: Secondary | ICD-10-CM | POA: Diagnosis not present

## 2018-07-02 DIAGNOSIS — E1165 Type 2 diabetes mellitus with hyperglycemia: Secondary | ICD-10-CM | POA: Diagnosis not present

## 2018-07-02 DIAGNOSIS — R509 Fever, unspecified: Secondary | ICD-10-CM | POA: Diagnosis not present

## 2018-07-02 DIAGNOSIS — Z299 Encounter for prophylactic measures, unspecified: Secondary | ICD-10-CM | POA: Diagnosis not present

## 2018-07-02 DIAGNOSIS — F17210 Nicotine dependence, cigarettes, uncomplicated: Secondary | ICD-10-CM | POA: Diagnosis not present

## 2018-07-02 DIAGNOSIS — I10 Essential (primary) hypertension: Secondary | ICD-10-CM | POA: Diagnosis not present

## 2018-07-02 DIAGNOSIS — J019 Acute sinusitis, unspecified: Secondary | ICD-10-CM | POA: Diagnosis not present

## 2018-09-22 DIAGNOSIS — M5136 Other intervertebral disc degeneration, lumbar region: Secondary | ICD-10-CM | POA: Diagnosis not present

## 2018-09-22 DIAGNOSIS — M47816 Spondylosis without myelopathy or radiculopathy, lumbar region: Secondary | ICD-10-CM | POA: Diagnosis not present

## 2018-09-22 DIAGNOSIS — Z79891 Long term (current) use of opiate analgesic: Secondary | ICD-10-CM | POA: Diagnosis not present

## 2018-09-22 DIAGNOSIS — M48061 Spinal stenosis, lumbar region without neurogenic claudication: Secondary | ICD-10-CM | POA: Diagnosis not present

## 2018-09-22 DIAGNOSIS — G894 Chronic pain syndrome: Secondary | ICD-10-CM | POA: Diagnosis not present

## 2018-09-22 DIAGNOSIS — M545 Low back pain: Secondary | ICD-10-CM | POA: Diagnosis not present

## 2018-10-15 DIAGNOSIS — E1165 Type 2 diabetes mellitus with hyperglycemia: Secondary | ICD-10-CM | POA: Diagnosis not present

## 2018-10-15 DIAGNOSIS — F17210 Nicotine dependence, cigarettes, uncomplicated: Secondary | ICD-10-CM | POA: Diagnosis not present

## 2018-10-15 DIAGNOSIS — I10 Essential (primary) hypertension: Secondary | ICD-10-CM | POA: Diagnosis not present

## 2018-10-15 DIAGNOSIS — Z6826 Body mass index (BMI) 26.0-26.9, adult: Secondary | ICD-10-CM | POA: Diagnosis not present

## 2018-10-15 DIAGNOSIS — Z299 Encounter for prophylactic measures, unspecified: Secondary | ICD-10-CM | POA: Diagnosis not present

## 2018-11-22 DIAGNOSIS — R3915 Urgency of urination: Secondary | ICD-10-CM | POA: Diagnosis not present

## 2018-11-22 DIAGNOSIS — N528 Other male erectile dysfunction: Secondary | ICD-10-CM | POA: Diagnosis not present

## 2018-11-22 DIAGNOSIS — Z87438 Personal history of other diseases of male genital organs: Secondary | ICD-10-CM | POA: Diagnosis not present

## 2018-11-22 DIAGNOSIS — R3914 Feeling of incomplete bladder emptying: Secondary | ICD-10-CM | POA: Diagnosis not present

## 2018-11-22 DIAGNOSIS — E119 Type 2 diabetes mellitus without complications: Secondary | ICD-10-CM | POA: Diagnosis not present

## 2018-11-22 DIAGNOSIS — N3941 Urge incontinence: Secondary | ICD-10-CM | POA: Diagnosis not present

## 2018-11-22 DIAGNOSIS — N401 Enlarged prostate with lower urinary tract symptoms: Secondary | ICD-10-CM | POA: Diagnosis not present

## 2018-12-03 DIAGNOSIS — Z01818 Encounter for other preprocedural examination: Secondary | ICD-10-CM | POA: Diagnosis not present

## 2018-12-03 DIAGNOSIS — M5136 Other intervertebral disc degeneration, lumbar region: Secondary | ICD-10-CM | POA: Diagnosis not present

## 2018-12-03 DIAGNOSIS — Z1159 Encounter for screening for other viral diseases: Secondary | ICD-10-CM | POA: Diagnosis not present

## 2018-12-03 DIAGNOSIS — M48061 Spinal stenosis, lumbar region without neurogenic claudication: Secondary | ICD-10-CM | POA: Diagnosis not present

## 2018-12-06 DIAGNOSIS — Z79899 Other long term (current) drug therapy: Secondary | ICD-10-CM | POA: Diagnosis not present

## 2018-12-06 DIAGNOSIS — E7800 Pure hypercholesterolemia, unspecified: Secondary | ICD-10-CM | POA: Diagnosis not present

## 2018-12-06 DIAGNOSIS — F329 Major depressive disorder, single episode, unspecified: Secondary | ICD-10-CM | POA: Diagnosis not present

## 2018-12-06 DIAGNOSIS — I10 Essential (primary) hypertension: Secondary | ICD-10-CM | POA: Diagnosis not present

## 2018-12-06 DIAGNOSIS — M5136 Other intervertebral disc degeneration, lumbar region: Secondary | ICD-10-CM | POA: Diagnosis not present

## 2018-12-06 DIAGNOSIS — G4730 Sleep apnea, unspecified: Secondary | ICD-10-CM | POA: Diagnosis not present

## 2018-12-06 DIAGNOSIS — M47816 Spondylosis without myelopathy or radiculopathy, lumbar region: Secondary | ICD-10-CM | POA: Diagnosis not present

## 2018-12-06 DIAGNOSIS — Z9682 Presence of neurostimulator: Secondary | ICD-10-CM | POA: Diagnosis not present

## 2018-12-22 DIAGNOSIS — M545 Low back pain: Secondary | ICD-10-CM | POA: Diagnosis not present

## 2018-12-22 DIAGNOSIS — R51 Headache: Secondary | ICD-10-CM | POA: Diagnosis not present

## 2018-12-22 DIAGNOSIS — Z79899 Other long term (current) drug therapy: Secondary | ICD-10-CM | POA: Diagnosis not present

## 2018-12-22 DIAGNOSIS — E785 Hyperlipidemia, unspecified: Secondary | ICD-10-CM | POA: Diagnosis not present

## 2018-12-22 DIAGNOSIS — M25552 Pain in left hip: Secondary | ICD-10-CM | POA: Diagnosis not present

## 2018-12-22 DIAGNOSIS — G8929 Other chronic pain: Secondary | ICD-10-CM | POA: Diagnosis not present

## 2018-12-22 DIAGNOSIS — M79652 Pain in left thigh: Secondary | ICD-10-CM | POA: Diagnosis not present

## 2018-12-22 DIAGNOSIS — G4700 Insomnia, unspecified: Secondary | ICD-10-CM | POA: Diagnosis not present

## 2018-12-22 DIAGNOSIS — I10 Essential (primary) hypertension: Secondary | ICD-10-CM | POA: Diagnosis not present

## 2018-12-22 DIAGNOSIS — M5441 Lumbago with sciatica, right side: Secondary | ICD-10-CM | POA: Diagnosis not present

## 2018-12-22 DIAGNOSIS — M5442 Lumbago with sciatica, left side: Secondary | ICD-10-CM | POA: Diagnosis not present

## 2018-12-22 DIAGNOSIS — F17200 Nicotine dependence, unspecified, uncomplicated: Secondary | ICD-10-CM | POA: Diagnosis not present

## 2018-12-28 ENCOUNTER — Emergency Department (HOSPITAL_COMMUNITY)
Admission: EM | Admit: 2018-12-28 | Discharge: 2018-12-28 | Discharge status: Against Medical Advice | Payer: Medicare Other | Attending: Emergency Medicine | Admitting: Emergency Medicine

## 2018-12-28 ENCOUNTER — Other Ambulatory Visit: Payer: Self-pay

## 2018-12-28 ENCOUNTER — Encounter (HOSPITAL_COMMUNITY): Payer: Self-pay | Admitting: Emergency Medicine

## 2018-12-28 DIAGNOSIS — M549 Dorsalgia, unspecified: Secondary | ICD-10-CM | POA: Diagnosis not present

## 2018-12-28 DIAGNOSIS — I700 Atherosclerosis of aorta: Secondary | ICD-10-CM | POA: Diagnosis not present

## 2018-12-28 DIAGNOSIS — R1032 Left lower quadrant pain: Secondary | ICD-10-CM | POA: Diagnosis not present

## 2018-12-28 DIAGNOSIS — I10 Essential (primary) hypertension: Secondary | ICD-10-CM | POA: Diagnosis not present

## 2018-12-28 DIAGNOSIS — N281 Cyst of kidney, acquired: Secondary | ICD-10-CM | POA: Diagnosis not present

## 2018-12-28 DIAGNOSIS — Z6825 Body mass index (BMI) 25.0-25.9, adult: Secondary | ICD-10-CM | POA: Diagnosis not present

## 2018-12-28 DIAGNOSIS — R109 Unspecified abdominal pain: Secondary | ICD-10-CM | POA: Diagnosis not present

## 2018-12-28 DIAGNOSIS — Z299 Encounter for prophylactic measures, unspecified: Secondary | ICD-10-CM | POA: Diagnosis not present

## 2018-12-28 DIAGNOSIS — Z5321 Procedure and treatment not carried out due to patient leaving prior to being seen by health care provider: Secondary | ICD-10-CM | POA: Diagnosis not present

## 2018-12-28 NOTE — ED Triage Notes (Signed)
C/o left lower abdominal pain (9/10). pain runs from left groin to lower abdominal and back.  Pt has history of chronic back pain. Seen by Dr Lyla Son June 29 th for back injections.

## 2018-12-31 DIAGNOSIS — M47816 Spondylosis without myelopathy or radiculopathy, lumbar region: Secondary | ICD-10-CM | POA: Diagnosis not present

## 2018-12-31 DIAGNOSIS — M545 Low back pain: Secondary | ICD-10-CM | POA: Diagnosis not present

## 2018-12-31 DIAGNOSIS — M48061 Spinal stenosis, lumbar region without neurogenic claudication: Secondary | ICD-10-CM | POA: Diagnosis not present

## 2018-12-31 DIAGNOSIS — M5136 Other intervertebral disc degeneration, lumbar region: Secondary | ICD-10-CM | POA: Diagnosis not present

## 2018-12-31 DIAGNOSIS — G894 Chronic pain syndrome: Secondary | ICD-10-CM | POA: Diagnosis not present

## 2018-12-31 DIAGNOSIS — T85192D Other mechanical complication of implanted electronic neurostimulator (electrode) of spinal cord, subsequent encounter: Secondary | ICD-10-CM | POA: Diagnosis not present

## 2019-01-10 DIAGNOSIS — Z79891 Long term (current) use of opiate analgesic: Secondary | ICD-10-CM | POA: Diagnosis not present

## 2019-01-21 DIAGNOSIS — E1165 Type 2 diabetes mellitus with hyperglycemia: Secondary | ICD-10-CM | POA: Diagnosis not present

## 2019-01-21 DIAGNOSIS — I700 Atherosclerosis of aorta: Secondary | ICD-10-CM | POA: Diagnosis not present

## 2019-01-21 DIAGNOSIS — Z299 Encounter for prophylactic measures, unspecified: Secondary | ICD-10-CM | POA: Diagnosis not present

## 2019-01-21 DIAGNOSIS — I10 Essential (primary) hypertension: Secondary | ICD-10-CM | POA: Diagnosis not present

## 2019-01-21 DIAGNOSIS — M549 Dorsalgia, unspecified: Secondary | ICD-10-CM | POA: Diagnosis not present

## 2019-01-21 DIAGNOSIS — Z6825 Body mass index (BMI) 25.0-25.9, adult: Secondary | ICD-10-CM | POA: Diagnosis not present

## 2019-01-21 DIAGNOSIS — F17210 Nicotine dependence, cigarettes, uncomplicated: Secondary | ICD-10-CM | POA: Diagnosis not present

## 2019-03-09 ENCOUNTER — Other Ambulatory Visit: Payer: Self-pay | Admitting: Nurse Practitioner

## 2019-03-09 DIAGNOSIS — G894 Chronic pain syndrome: Secondary | ICD-10-CM

## 2019-03-16 ENCOUNTER — Ambulatory Visit (HOSPITAL_COMMUNITY)
Admission: RE | Admit: 2019-03-16 | Discharge: 2019-03-16 | Disposition: A | Discharge status: Home / Self Care | Payer: Medicare Other | Source: Ambulatory Visit | Attending: Nurse Practitioner | Admitting: Nurse Practitioner

## 2019-03-16 ENCOUNTER — Other Ambulatory Visit: Payer: Self-pay

## 2019-03-16 DIAGNOSIS — G894 Chronic pain syndrome: Secondary | ICD-10-CM | POA: Insufficient documentation

## 2019-03-16 DIAGNOSIS — R948 Abnormal results of function studies of other organs and systems: Secondary | ICD-10-CM | POA: Diagnosis not present

## 2019-03-16 MED ORDER — TECHNETIUM TC 99M MEDRONATE IV KIT
21.0000 | PACK | Freq: Once | INTRAVENOUS | Status: AC | PRN
  Administered 2019-03-16: 21 via INTRAVENOUS

## 2019-03-29 DIAGNOSIS — Z01818 Encounter for other preprocedural examination: Secondary | ICD-10-CM | POA: Diagnosis not present

## 2019-04-01 DIAGNOSIS — I10 Essential (primary) hypertension: Secondary | ICD-10-CM | POA: Diagnosis not present

## 2019-04-01 DIAGNOSIS — M8580 Other specified disorders of bone density and structure, unspecified site: Secondary | ICD-10-CM | POA: Diagnosis not present

## 2019-04-01 DIAGNOSIS — G4730 Sleep apnea, unspecified: Secondary | ICD-10-CM | POA: Diagnosis not present

## 2019-04-01 DIAGNOSIS — M47816 Spondylosis without myelopathy or radiculopathy, lumbar region: Secondary | ICD-10-CM | POA: Diagnosis not present

## 2019-04-01 DIAGNOSIS — G894 Chronic pain syndrome: Secondary | ICD-10-CM | POA: Diagnosis not present

## 2019-04-01 DIAGNOSIS — E7800 Pure hypercholesterolemia, unspecified: Secondary | ICD-10-CM | POA: Diagnosis not present

## 2019-04-01 DIAGNOSIS — S22080A Wedge compression fracture of T11-T12 vertebra, initial encounter for closed fracture: Secondary | ICD-10-CM | POA: Diagnosis not present

## 2019-04-01 DIAGNOSIS — M5136 Other intervertebral disc degeneration, lumbar region: Secondary | ICD-10-CM | POA: Diagnosis not present

## 2019-04-01 DIAGNOSIS — M48061 Spinal stenosis, lumbar region without neurogenic claudication: Secondary | ICD-10-CM | POA: Diagnosis not present

## 2019-04-01 DIAGNOSIS — E785 Hyperlipidemia, unspecified: Secondary | ICD-10-CM | POA: Diagnosis not present

## 2019-04-01 DIAGNOSIS — M4854XA Collapsed vertebra, not elsewhere classified, thoracic region, initial encounter for fracture: Secondary | ICD-10-CM | POA: Diagnosis not present

## 2019-04-01 DIAGNOSIS — F17200 Nicotine dependence, unspecified, uncomplicated: Secondary | ICD-10-CM | POA: Diagnosis not present

## 2019-04-01 DIAGNOSIS — Z79899 Other long term (current) drug therapy: Secondary | ICD-10-CM | POA: Diagnosis not present

## 2019-04-11 DIAGNOSIS — Z79891 Long term (current) use of opiate analgesic: Secondary | ICD-10-CM | POA: Diagnosis not present

## 2019-04-11 DIAGNOSIS — S22080G Wedge compression fracture of T11-T12 vertebra, subsequent encounter for fracture with delayed healing: Secondary | ICD-10-CM | POA: Diagnosis not present

## 2019-04-11 DIAGNOSIS — M47816 Spondylosis without myelopathy or radiculopathy, lumbar region: Secondary | ICD-10-CM | POA: Diagnosis not present

## 2019-04-11 DIAGNOSIS — G894 Chronic pain syndrome: Secondary | ICD-10-CM | POA: Diagnosis not present

## 2019-04-11 DIAGNOSIS — M545 Low back pain: Secondary | ICD-10-CM | POA: Diagnosis not present

## 2019-04-11 DIAGNOSIS — M5136 Other intervertebral disc degeneration, lumbar region: Secondary | ICD-10-CM | POA: Diagnosis not present

## 2019-04-14 IMAGING — CR DG CHEST 2V
2 series · 2 of 2 positions shown · non-contrast
Comparison: Single-view of the chest 09/25/2016.

CLINICAL DATA: Preoperative examination. Patient for cervical spine
surgery.

EXAM:
CHEST  2 VIEW

[w chest pa]
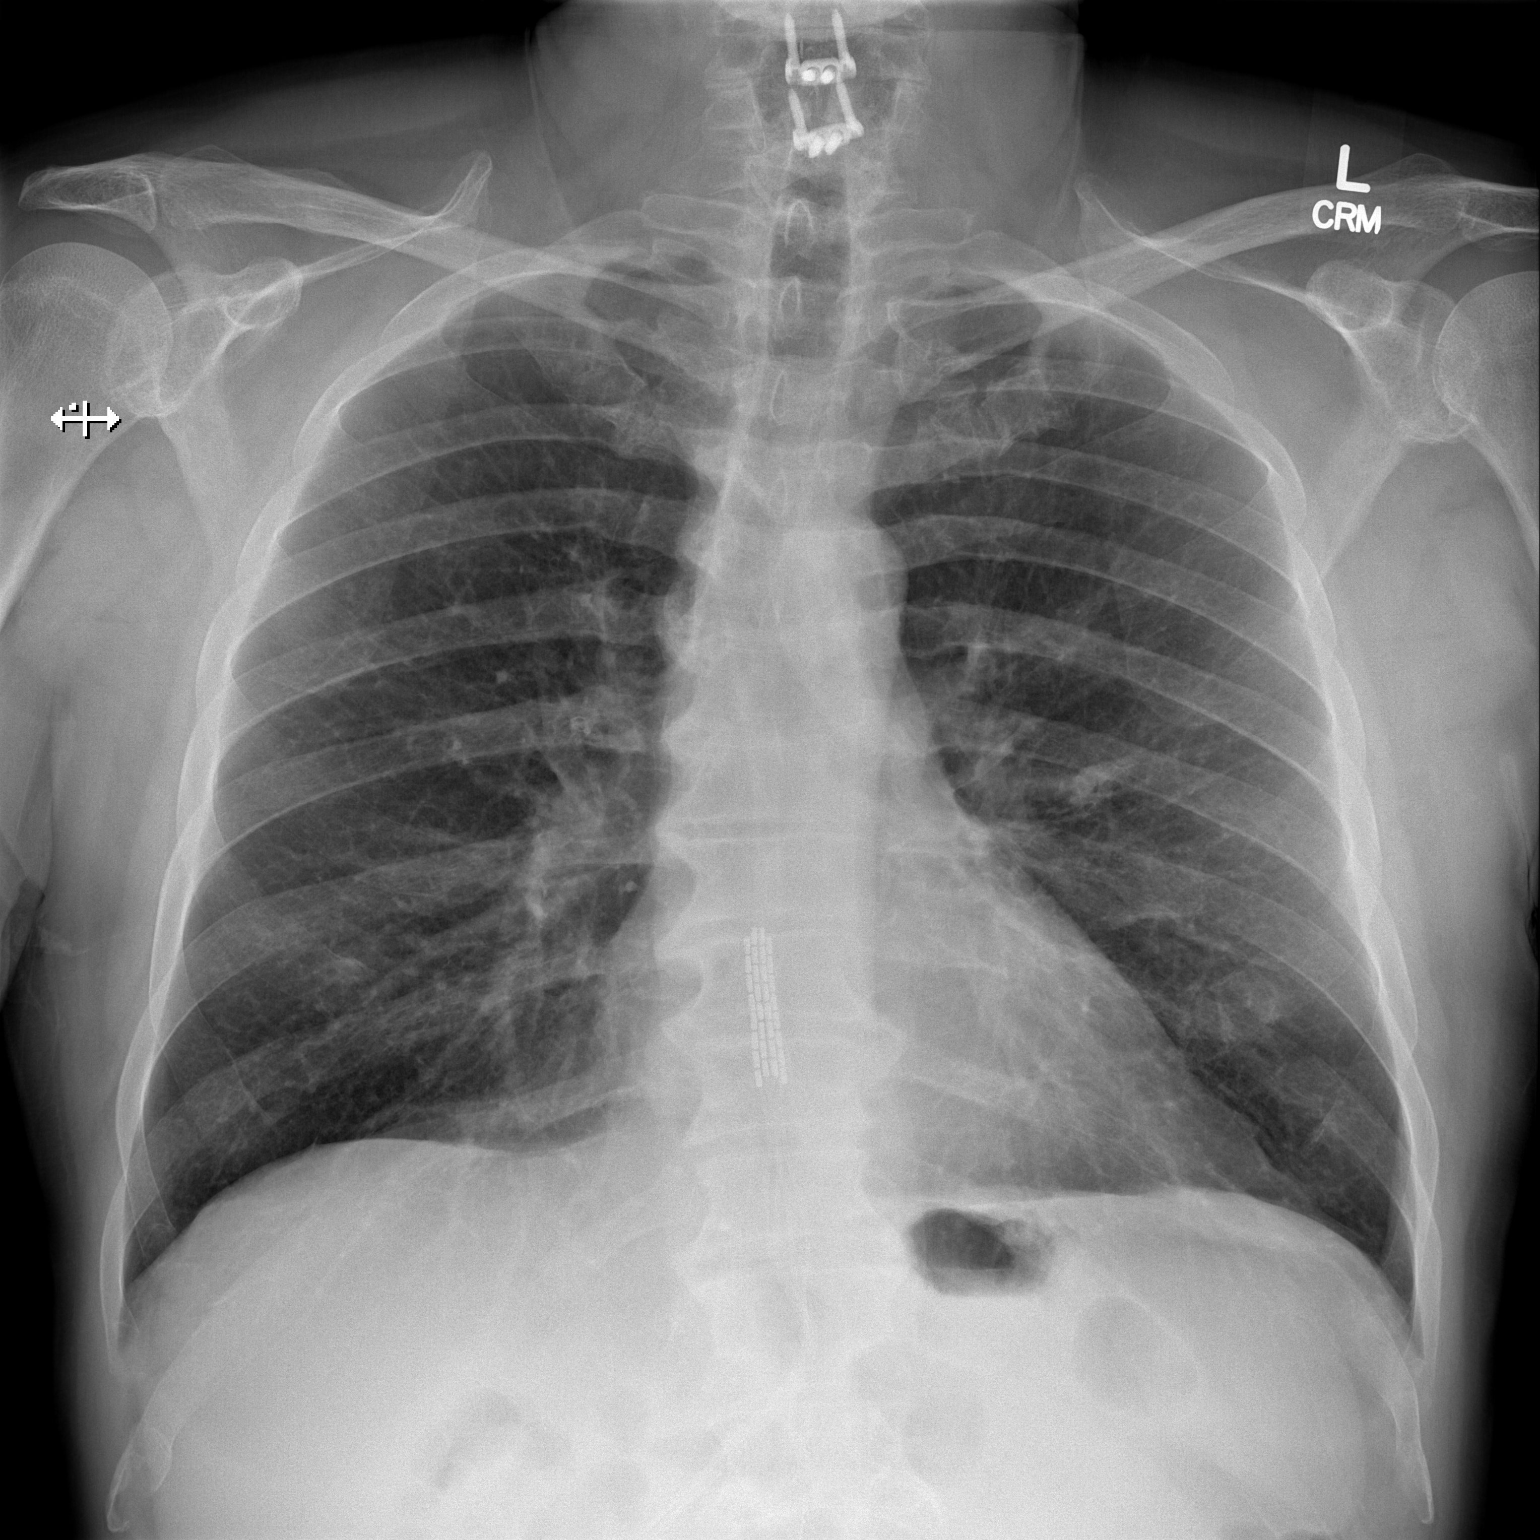

[w chest lat]
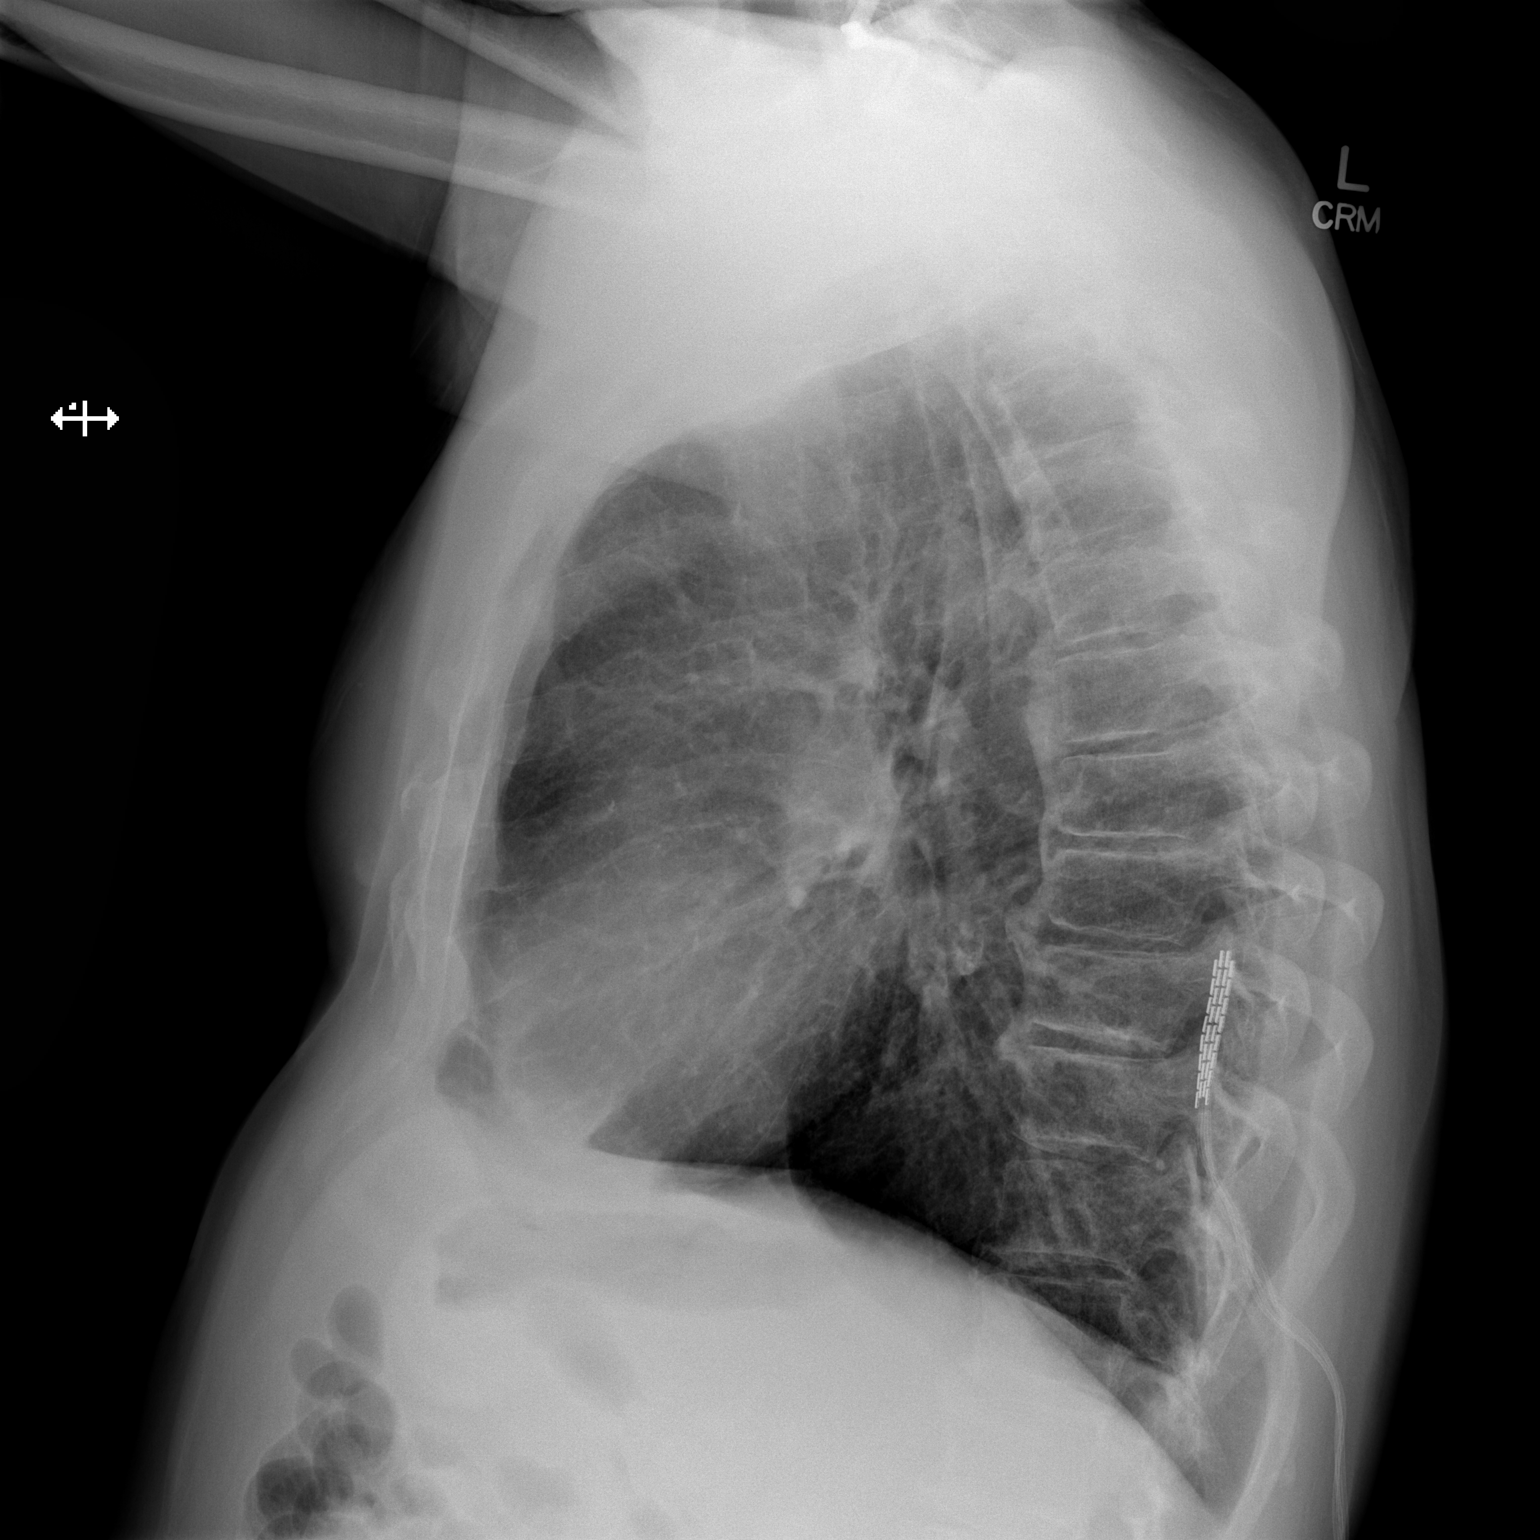

[2 of 2 positions shown; findings below may reference images not displayed]

FINDINGS: The lungs are clear. Heart size is normal. No pneumothorax or
pleural effusion. Aortic atherosclerosis is noted. Spinal stimulator
is in place.
IMPRESSION: No acute disease.

## 2019-04-26 DIAGNOSIS — E1165 Type 2 diabetes mellitus with hyperglycemia: Secondary | ICD-10-CM | POA: Diagnosis not present

## 2019-04-26 DIAGNOSIS — Z6825 Body mass index (BMI) 25.0-25.9, adult: Secondary | ICD-10-CM | POA: Diagnosis not present

## 2019-04-26 DIAGNOSIS — F17210 Nicotine dependence, cigarettes, uncomplicated: Secondary | ICD-10-CM | POA: Diagnosis not present

## 2019-04-26 DIAGNOSIS — I700 Atherosclerosis of aorta: Secondary | ICD-10-CM | POA: Diagnosis not present

## 2019-04-26 DIAGNOSIS — I10 Essential (primary) hypertension: Secondary | ICD-10-CM | POA: Diagnosis not present

## 2019-04-26 DIAGNOSIS — Z299 Encounter for prophylactic measures, unspecified: Secondary | ICD-10-CM | POA: Diagnosis not present

## 2019-06-14 DIAGNOSIS — Z299 Encounter for prophylactic measures, unspecified: Secondary | ICD-10-CM | POA: Diagnosis not present

## 2019-06-14 DIAGNOSIS — R5383 Other fatigue: Secondary | ICD-10-CM | POA: Diagnosis not present

## 2019-06-14 DIAGNOSIS — Z7189 Other specified counseling: Secondary | ICD-10-CM | POA: Diagnosis not present

## 2019-06-14 DIAGNOSIS — E7800 Pure hypercholesterolemia, unspecified: Secondary | ICD-10-CM | POA: Diagnosis not present

## 2019-06-14 DIAGNOSIS — E1165 Type 2 diabetes mellitus with hyperglycemia: Secondary | ICD-10-CM | POA: Diagnosis not present

## 2019-06-14 DIAGNOSIS — Z1211 Encounter for screening for malignant neoplasm of colon: Secondary | ICD-10-CM | POA: Diagnosis not present

## 2019-06-14 DIAGNOSIS — Z6826 Body mass index (BMI) 26.0-26.9, adult: Secondary | ICD-10-CM | POA: Diagnosis not present

## 2019-06-14 DIAGNOSIS — Z1339 Encounter for screening examination for other mental health and behavioral disorders: Secondary | ICD-10-CM | POA: Diagnosis not present

## 2019-06-14 DIAGNOSIS — Z1331 Encounter for screening for depression: Secondary | ICD-10-CM | POA: Diagnosis not present

## 2019-06-14 DIAGNOSIS — I10 Essential (primary) hypertension: Secondary | ICD-10-CM | POA: Diagnosis not present

## 2019-06-14 DIAGNOSIS — Z79899 Other long term (current) drug therapy: Secondary | ICD-10-CM | POA: Diagnosis not present

## 2019-06-14 DIAGNOSIS — Z125 Encounter for screening for malignant neoplasm of prostate: Secondary | ICD-10-CM | POA: Diagnosis not present

## 2019-06-14 DIAGNOSIS — Z0000 Encounter for general adult medical examination without abnormal findings: Secondary | ICD-10-CM | POA: Diagnosis not present

## 2019-07-06 DIAGNOSIS — M47816 Spondylosis without myelopathy or radiculopathy, lumbar region: Secondary | ICD-10-CM | POA: Diagnosis not present

## 2019-07-06 DIAGNOSIS — M545 Low back pain: Secondary | ICD-10-CM | POA: Diagnosis not present

## 2019-07-06 DIAGNOSIS — M5136 Other intervertebral disc degeneration, lumbar region: Secondary | ICD-10-CM | POA: Diagnosis not present

## 2019-07-06 DIAGNOSIS — G894 Chronic pain syndrome: Secondary | ICD-10-CM | POA: Diagnosis not present

## 2019-07-06 DIAGNOSIS — M48061 Spinal stenosis, lumbar region without neurogenic claudication: Secondary | ICD-10-CM | POA: Diagnosis not present

## 2019-07-06 DIAGNOSIS — M1712 Unilateral primary osteoarthritis, left knee: Secondary | ICD-10-CM | POA: Diagnosis not present

## 2019-07-06 DIAGNOSIS — S22080D Wedge compression fracture of T11-T12 vertebra, subsequent encounter for fracture with routine healing: Secondary | ICD-10-CM | POA: Diagnosis not present

## 2019-07-06 DIAGNOSIS — Z79891 Long term (current) use of opiate analgesic: Secondary | ICD-10-CM | POA: Diagnosis not present

## 2019-09-14 DIAGNOSIS — Z23 Encounter for immunization: Secondary | ICD-10-CM | POA: Diagnosis not present

## 2019-09-29 DIAGNOSIS — H5203 Hypermetropia, bilateral: Secondary | ICD-10-CM | POA: Diagnosis not present

## 2019-09-29 DIAGNOSIS — H524 Presbyopia: Secondary | ICD-10-CM | POA: Diagnosis not present

## 2019-09-29 DIAGNOSIS — H52223 Regular astigmatism, bilateral: Secondary | ICD-10-CM | POA: Diagnosis not present

## 2019-09-29 DIAGNOSIS — H2513 Age-related nuclear cataract, bilateral: Secondary | ICD-10-CM | POA: Diagnosis not present

## 2019-09-29 DIAGNOSIS — E119 Type 2 diabetes mellitus without complications: Secondary | ICD-10-CM | POA: Diagnosis not present

## 2019-10-11 DIAGNOSIS — Z23 Encounter for immunization: Secondary | ICD-10-CM | POA: Diagnosis not present

## 2019-10-13 DIAGNOSIS — M48061 Spinal stenosis, lumbar region without neurogenic claudication: Secondary | ICD-10-CM | POA: Diagnosis not present

## 2019-10-13 DIAGNOSIS — M5136 Other intervertebral disc degeneration, lumbar region: Secondary | ICD-10-CM | POA: Diagnosis not present

## 2019-10-13 DIAGNOSIS — Z5181 Encounter for therapeutic drug level monitoring: Secondary | ICD-10-CM | POA: Diagnosis not present

## 2019-10-13 DIAGNOSIS — Z79891 Long term (current) use of opiate analgesic: Secondary | ICD-10-CM | POA: Diagnosis not present

## 2019-10-13 DIAGNOSIS — G894 Chronic pain syndrome: Secondary | ICD-10-CM | POA: Diagnosis not present

## 2019-10-13 DIAGNOSIS — M545 Low back pain: Secondary | ICD-10-CM | POA: Diagnosis not present

## 2019-10-13 DIAGNOSIS — M1712 Unilateral primary osteoarthritis, left knee: Secondary | ICD-10-CM | POA: Diagnosis not present

## 2019-11-08 DIAGNOSIS — I10 Essential (primary) hypertension: Secondary | ICD-10-CM | POA: Diagnosis not present

## 2019-11-08 DIAGNOSIS — L723 Sebaceous cyst: Secondary | ICD-10-CM | POA: Diagnosis not present

## 2019-11-08 DIAGNOSIS — Z299 Encounter for prophylactic measures, unspecified: Secondary | ICD-10-CM | POA: Diagnosis not present

## 2019-11-08 DIAGNOSIS — R05 Cough: Secondary | ICD-10-CM | POA: Diagnosis not present

## 2019-11-08 DIAGNOSIS — F17210 Nicotine dependence, cigarettes, uncomplicated: Secondary | ICD-10-CM | POA: Diagnosis not present

## 2019-11-08 DIAGNOSIS — E1165 Type 2 diabetes mellitus with hyperglycemia: Secondary | ICD-10-CM | POA: Diagnosis not present

## 2020-01-12 DIAGNOSIS — M48061 Spinal stenosis, lumbar region without neurogenic claudication: Secondary | ICD-10-CM | POA: Diagnosis not present

## 2020-01-12 DIAGNOSIS — M5136 Other intervertebral disc degeneration, lumbar region: Secondary | ICD-10-CM | POA: Diagnosis not present

## 2020-01-12 DIAGNOSIS — G894 Chronic pain syndrome: Secondary | ICD-10-CM | POA: Diagnosis not present

## 2020-01-12 DIAGNOSIS — M545 Low back pain: Secondary | ICD-10-CM | POA: Diagnosis not present

## 2020-04-02 ENCOUNTER — Ambulatory Visit: Payer: Medicare Other | Admitting: Orthopedic Surgery

## 2020-05-01 DIAGNOSIS — Z79891 Long term (current) use of opiate analgesic: Secondary | ICD-10-CM | POA: Diagnosis not present

## 2020-05-01 DIAGNOSIS — Z4542 Encounter for adjustment and management of neuropacemaker (brain) (peripheral nerve) (spinal cord): Secondary | ICD-10-CM | POA: Diagnosis not present

## 2020-05-01 DIAGNOSIS — M5136 Other intervertebral disc degeneration, lumbar region: Secondary | ICD-10-CM | POA: Diagnosis not present

## 2020-05-01 DIAGNOSIS — M4807 Spinal stenosis, lumbosacral region: Secondary | ICD-10-CM | POA: Diagnosis not present

## 2020-05-01 DIAGNOSIS — M5416 Radiculopathy, lumbar region: Secondary | ICD-10-CM | POA: Diagnosis not present

## 2020-05-30 DIAGNOSIS — I10 Essential (primary) hypertension: Secondary | ICD-10-CM | POA: Diagnosis not present

## 2020-05-30 DIAGNOSIS — E1165 Type 2 diabetes mellitus with hyperglycemia: Secondary | ICD-10-CM | POA: Diagnosis not present

## 2020-05-30 DIAGNOSIS — F17210 Nicotine dependence, cigarettes, uncomplicated: Secondary | ICD-10-CM | POA: Diagnosis not present

## 2020-05-30 DIAGNOSIS — Z299 Encounter for prophylactic measures, unspecified: Secondary | ICD-10-CM | POA: Diagnosis not present

## 2020-05-30 DIAGNOSIS — Z23 Encounter for immunization: Secondary | ICD-10-CM | POA: Diagnosis not present

## 2020-05-30 DIAGNOSIS — I700 Atherosclerosis of aorta: Secondary | ICD-10-CM | POA: Diagnosis not present

## 2020-06-14 DIAGNOSIS — Z6825 Body mass index (BMI) 25.0-25.9, adult: Secondary | ICD-10-CM | POA: Diagnosis not present

## 2020-06-14 DIAGNOSIS — E7800 Pure hypercholesterolemia, unspecified: Secondary | ICD-10-CM | POA: Diagnosis not present

## 2020-06-14 DIAGNOSIS — R5383 Other fatigue: Secondary | ICD-10-CM | POA: Diagnosis not present

## 2020-06-14 DIAGNOSIS — Z79899 Other long term (current) drug therapy: Secondary | ICD-10-CM | POA: Diagnosis not present

## 2020-06-14 DIAGNOSIS — Z0000 Encounter for general adult medical examination without abnormal findings: Secondary | ICD-10-CM | POA: Diagnosis not present

## 2020-06-14 DIAGNOSIS — Z7189 Other specified counseling: Secondary | ICD-10-CM | POA: Diagnosis not present

## 2020-06-14 DIAGNOSIS — I10 Essential (primary) hypertension: Secondary | ICD-10-CM | POA: Diagnosis not present

## 2020-06-14 DIAGNOSIS — Z299 Encounter for prophylactic measures, unspecified: Secondary | ICD-10-CM | POA: Diagnosis not present

## 2020-06-14 DIAGNOSIS — Z125 Encounter for screening for malignant neoplasm of prostate: Secondary | ICD-10-CM | POA: Diagnosis not present

## 2020-06-14 DIAGNOSIS — Z1331 Encounter for screening for depression: Secondary | ICD-10-CM | POA: Diagnosis not present

## 2020-06-14 DIAGNOSIS — F17210 Nicotine dependence, cigarettes, uncomplicated: Secondary | ICD-10-CM | POA: Diagnosis not present

## 2020-06-14 DIAGNOSIS — Z1339 Encounter for screening examination for other mental health and behavioral disorders: Secondary | ICD-10-CM | POA: Diagnosis not present

## 2020-06-15 DIAGNOSIS — Z720 Tobacco use: Secondary | ICD-10-CM | POA: Diagnosis not present

## 2020-06-15 DIAGNOSIS — I440 Atrioventricular block, first degree: Secondary | ICD-10-CM | POA: Diagnosis not present

## 2020-06-15 DIAGNOSIS — F17200 Nicotine dependence, unspecified, uncomplicated: Secondary | ICD-10-CM | POA: Diagnosis not present

## 2020-06-15 DIAGNOSIS — J189 Pneumonia, unspecified organism: Secondary | ICD-10-CM | POA: Diagnosis not present

## 2020-06-15 DIAGNOSIS — J439 Emphysema, unspecified: Secondary | ICD-10-CM | POA: Diagnosis not present

## 2020-06-15 DIAGNOSIS — N281 Cyst of kidney, acquired: Secondary | ICD-10-CM | POA: Diagnosis not present

## 2020-06-15 DIAGNOSIS — R0789 Other chest pain: Secondary | ICD-10-CM | POA: Diagnosis not present

## 2020-06-15 DIAGNOSIS — R001 Bradycardia, unspecified: Secondary | ICD-10-CM | POA: Diagnosis not present

## 2020-06-15 DIAGNOSIS — I700 Atherosclerosis of aorta: Secondary | ICD-10-CM | POA: Diagnosis not present

## 2020-06-15 DIAGNOSIS — Z79899 Other long term (current) drug therapy: Secondary | ICD-10-CM | POA: Diagnosis not present

## 2020-06-15 DIAGNOSIS — I2510 Atherosclerotic heart disease of native coronary artery without angina pectoris: Secondary | ICD-10-CM | POA: Diagnosis not present

## 2020-06-15 DIAGNOSIS — R079 Chest pain, unspecified: Secondary | ICD-10-CM | POA: Diagnosis not present

## 2020-06-15 DIAGNOSIS — I10 Essential (primary) hypertension: Secondary | ICD-10-CM | POA: Diagnosis not present

## 2020-06-15 DIAGNOSIS — S22080A Wedge compression fracture of T11-T12 vertebra, initial encounter for closed fracture: Secondary | ICD-10-CM | POA: Diagnosis not present

## 2020-06-15 DIAGNOSIS — Z20822 Contact with and (suspected) exposure to covid-19: Secondary | ICD-10-CM | POA: Diagnosis not present

## 2020-06-15 DIAGNOSIS — R9431 Abnormal electrocardiogram [ECG] [EKG]: Secondary | ICD-10-CM | POA: Diagnosis not present

## 2020-06-15 DIAGNOSIS — N3289 Other specified disorders of bladder: Secondary | ICD-10-CM | POA: Diagnosis not present

## 2020-06-15 DIAGNOSIS — M47814 Spondylosis without myelopathy or radiculopathy, thoracic region: Secondary | ICD-10-CM | POA: Diagnosis not present

## 2020-06-16 DIAGNOSIS — N281 Cyst of kidney, acquired: Secondary | ICD-10-CM | POA: Diagnosis not present

## 2020-06-16 DIAGNOSIS — M47814 Spondylosis without myelopathy or radiculopathy, thoracic region: Secondary | ICD-10-CM | POA: Diagnosis not present

## 2020-06-16 DIAGNOSIS — N3289 Other specified disorders of bladder: Secondary | ICD-10-CM | POA: Diagnosis not present

## 2020-06-16 DIAGNOSIS — I2510 Atherosclerotic heart disease of native coronary artery without angina pectoris: Secondary | ICD-10-CM | POA: Diagnosis not present

## 2020-06-16 DIAGNOSIS — S22080A Wedge compression fracture of T11-T12 vertebra, initial encounter for closed fracture: Secondary | ICD-10-CM | POA: Diagnosis not present

## 2020-06-16 DIAGNOSIS — J439 Emphysema, unspecified: Secondary | ICD-10-CM | POA: Diagnosis not present

## 2020-06-19 DIAGNOSIS — E7800 Pure hypercholesterolemia, unspecified: Secondary | ICD-10-CM | POA: Diagnosis not present

## 2020-06-19 DIAGNOSIS — Z6825 Body mass index (BMI) 25.0-25.9, adult: Secondary | ICD-10-CM | POA: Diagnosis not present

## 2020-06-19 DIAGNOSIS — Z299 Encounter for prophylactic measures, unspecified: Secondary | ICD-10-CM | POA: Diagnosis not present

## 2020-06-19 DIAGNOSIS — J189 Pneumonia, unspecified organism: Secondary | ICD-10-CM | POA: Diagnosis not present

## 2020-06-19 DIAGNOSIS — S22080A Wedge compression fracture of T11-T12 vertebra, initial encounter for closed fracture: Secondary | ICD-10-CM | POA: Diagnosis not present

## 2020-06-19 DIAGNOSIS — F17210 Nicotine dependence, cigarettes, uncomplicated: Secondary | ICD-10-CM | POA: Diagnosis not present

## 2020-06-19 DIAGNOSIS — I10 Essential (primary) hypertension: Secondary | ICD-10-CM | POA: Diagnosis not present

## 2020-07-25 DIAGNOSIS — N401 Enlarged prostate with lower urinary tract symptoms: Secondary | ICD-10-CM | POA: Diagnosis not present

## 2020-07-25 DIAGNOSIS — N138 Other obstructive and reflux uropathy: Secondary | ICD-10-CM | POA: Diagnosis not present

## 2020-07-25 DIAGNOSIS — N35819 Other urethral stricture, male, unspecified site: Secondary | ICD-10-CM | POA: Diagnosis not present

## 2020-07-25 DIAGNOSIS — M546 Pain in thoracic spine: Secondary | ICD-10-CM | POA: Diagnosis not present

## 2020-07-30 DIAGNOSIS — M4807 Spinal stenosis, lumbosacral region: Secondary | ICD-10-CM | POA: Diagnosis not present

## 2020-07-30 DIAGNOSIS — M5136 Other intervertebral disc degeneration, lumbar region: Secondary | ICD-10-CM | POA: Diagnosis not present

## 2020-07-30 DIAGNOSIS — Z4542 Encounter for adjustment and management of neuropacemaker (brain) (peripheral nerve) (spinal cord): Secondary | ICD-10-CM | POA: Diagnosis not present

## 2020-10-29 DIAGNOSIS — Z4542 Encounter for adjustment and management of neuropacemaker (brain) (peripheral nerve) (spinal cord): Secondary | ICD-10-CM | POA: Diagnosis not present

## 2020-10-29 DIAGNOSIS — M5136 Other intervertebral disc degeneration, lumbar region: Secondary | ICD-10-CM | POA: Diagnosis not present

## 2020-10-29 DIAGNOSIS — G894 Chronic pain syndrome: Secondary | ICD-10-CM | POA: Diagnosis not present

## 2020-10-29 DIAGNOSIS — M4807 Spinal stenosis, lumbosacral region: Secondary | ICD-10-CM | POA: Diagnosis not present

## 2020-10-29 DIAGNOSIS — Z79891 Long term (current) use of opiate analgesic: Secondary | ICD-10-CM | POA: Diagnosis not present

## 2020-11-06 DIAGNOSIS — G4730 Sleep apnea, unspecified: Secondary | ICD-10-CM | POA: Diagnosis not present

## 2020-11-06 DIAGNOSIS — I10 Essential (primary) hypertension: Secondary | ICD-10-CM | POA: Diagnosis not present

## 2020-11-06 DIAGNOSIS — M5416 Radiculopathy, lumbar region: Secondary | ICD-10-CM | POA: Diagnosis not present

## 2020-11-06 DIAGNOSIS — E785 Hyperlipidemia, unspecified: Secondary | ICD-10-CM | POA: Diagnosis not present

## 2020-11-06 DIAGNOSIS — Z01812 Encounter for preprocedural laboratory examination: Secondary | ICD-10-CM | POA: Diagnosis not present

## 2020-11-09 DIAGNOSIS — F32A Depression, unspecified: Secondary | ICD-10-CM | POA: Diagnosis not present

## 2020-11-09 DIAGNOSIS — M5410 Radiculopathy, site unspecified: Secondary | ICD-10-CM | POA: Diagnosis not present

## 2020-11-09 DIAGNOSIS — E785 Hyperlipidemia, unspecified: Secondary | ICD-10-CM | POA: Diagnosis not present

## 2020-11-09 DIAGNOSIS — M1990 Unspecified osteoarthritis, unspecified site: Secondary | ICD-10-CM | POA: Diagnosis not present

## 2020-11-09 DIAGNOSIS — Z4542 Encounter for adjustment and management of neuropacemaker (brain) (peripheral nerve) (spinal cord): Secondary | ICD-10-CM | POA: Diagnosis not present

## 2020-11-09 DIAGNOSIS — M47816 Spondylosis without myelopathy or radiculopathy, lumbar region: Secondary | ICD-10-CM | POA: Diagnosis not present

## 2020-11-09 DIAGNOSIS — G4730 Sleep apnea, unspecified: Secondary | ICD-10-CM | POA: Diagnosis not present

## 2020-11-09 DIAGNOSIS — M4726 Other spondylosis with radiculopathy, lumbar region: Secondary | ICD-10-CM | POA: Diagnosis not present

## 2020-11-09 DIAGNOSIS — G894 Chronic pain syndrome: Secondary | ICD-10-CM | POA: Diagnosis not present

## 2020-11-09 DIAGNOSIS — Z79891 Long term (current) use of opiate analgesic: Secondary | ICD-10-CM | POA: Diagnosis not present

## 2020-11-09 DIAGNOSIS — M5136 Other intervertebral disc degeneration, lumbar region: Secondary | ICD-10-CM | POA: Diagnosis not present

## 2020-11-09 DIAGNOSIS — R202 Paresthesia of skin: Secondary | ICD-10-CM | POA: Diagnosis not present

## 2020-11-09 DIAGNOSIS — Z79899 Other long term (current) drug therapy: Secondary | ICD-10-CM | POA: Diagnosis not present

## 2020-11-09 DIAGNOSIS — M961 Postlaminectomy syndrome, not elsewhere classified: Secondary | ICD-10-CM | POA: Diagnosis not present

## 2020-11-09 DIAGNOSIS — Z981 Arthrodesis status: Secondary | ICD-10-CM | POA: Diagnosis not present

## 2020-11-09 DIAGNOSIS — F17210 Nicotine dependence, cigarettes, uncomplicated: Secondary | ICD-10-CM | POA: Diagnosis not present

## 2020-11-09 DIAGNOSIS — M5416 Radiculopathy, lumbar region: Secondary | ICD-10-CM | POA: Diagnosis not present

## 2020-11-09 DIAGNOSIS — M4712 Other spondylosis with myelopathy, cervical region: Secondary | ICD-10-CM | POA: Diagnosis not present

## 2020-11-09 DIAGNOSIS — I10 Essential (primary) hypertension: Secondary | ICD-10-CM | POA: Diagnosis not present

## 2020-11-09 DIAGNOSIS — G8929 Other chronic pain: Secondary | ICD-10-CM | POA: Diagnosis not present

## 2020-11-09 DIAGNOSIS — M5116 Intervertebral disc disorders with radiculopathy, lumbar region: Secondary | ICD-10-CM | POA: Diagnosis not present

## 2020-11-19 ENCOUNTER — Ambulatory Visit: Payer: Self-pay

## 2020-11-19 ENCOUNTER — Encounter: Payer: Self-pay | Admitting: Orthopedic Surgery

## 2020-11-19 ENCOUNTER — Ambulatory Visit (INDEPENDENT_AMBULATORY_CARE_PROVIDER_SITE_OTHER): Payer: Medicare Other | Admitting: Orthopedic Surgery

## 2020-11-19 ENCOUNTER — Other Ambulatory Visit: Payer: Self-pay

## 2020-11-19 VITALS — Ht 68.0 in | Wt 165.0 lb

## 2020-11-19 DIAGNOSIS — M25562 Pain in left knee: Secondary | ICD-10-CM | POA: Diagnosis not present

## 2020-11-19 DIAGNOSIS — G8929 Other chronic pain: Secondary | ICD-10-CM | POA: Diagnosis not present

## 2020-11-19 MED ORDER — LIDOCAINE HCL (PF) 1 % IJ SOLN
5.0000 mL | INTRAMUSCULAR | Status: AC | PRN
  Administered 2020-11-19: 5 mL

## 2020-11-19 MED ORDER — METHYLPREDNISOLONE ACETATE 40 MG/ML IJ SUSP
40.0000 mg | INTRAMUSCULAR | Status: AC | PRN
  Administered 2020-11-19: 40 mg via INTRA_ARTICULAR

## 2020-11-19 NOTE — Progress Notes (Signed)
Office Visit Note   Patient: Jordan Lambert           Date of Birth: 07/15/1955           MRN: [id number] Visit Date: 11/19/2020              Requested by: Monico Blitz, MD [address] [place],  [place] [address] PCP: Monico Blitz, MD  Chief Complaint  Patient presents with   Left Knee - Pain      HPI: Patient is is a 65 year old gentleman who was seen for evaluation of left knee pain that has gotten worse over the past several years.  Patient complains of lateral joint line pain denies any swelling denies any previous knee surgeries.  Patient is status post multitrauma in 2008 he states he had cervical fractures from C3-C7 and currently has a lumbar spine nerve stimulator in his back he states the batteries were just changed and they are compatible with an MRI scan.  Assessment & Plan: Visit Diagnoses:  1. Chronic pain of left knee     Plan: Patient's knee was injected we will reevaluate in 4 weeks.  If patient still has the lateral joint line symptoms over the lateral meniscus may need to obtain an MRI scan.  Follow-Up Instructions: Return in about 4 weeks (around 12/17/2020).   Ortho Exam  Patient is alert, oriented, no adenopathy, well-dressed, normal affect, normal respiratory effort. Examination patient has no effusion of the left knee there is no crepitation with range of motion no focal weakness.  Patient's patellofemoral joint is not tender to palpation the medial joint line is nontender to palpation collaterals and cruciates are stable.  He is point tender to palpation over the anterior medial joint line laterally.  There is a old healed abrasion over the patella but no bursitis.  Imaging: XR Knee 1-2 Views Left  Result Date: 11/19/2020 2 view radiographs of the left knee shows a congruent joint with no joint space narrowing no periarticular bone is close no effusion.  No images are attached to the encounter.  Labs: Lab Results  Component Value Date   HGBA1C 6.0 (H)  01/21/2017     No results found for: ALBUMIN, PREALBUMIN, CBC  No results found for: MG No results found for: VD25OH  No results found for: PREALBUMIN CBC EXTENDED Latest Ref Rng & Units 02/20/2017 01/21/2017  WBC 4.0 - 10.5 K/uL 12.8(H) 12.5(H)  RBC 4.22 - 5.81 MIL/uL 5.30 5.46  HGB 13.0 - 17.0 g/dL 16.6 16.9  HCT 39.0 - 52.0 % 48.6 48.6  PLT 150 - 400 K/uL 242 239  NEUTROABS 1.7 - 7.7 K/uL - 8.8(H)  LYMPHSABS 0.7 - 4.0 K/uL - 2.9     Body mass index is 25.09 kg/m.  Orders:  Orders Placed This Encounter  Procedures   XR Knee 1-2 Views Left   No orders of the defined types were placed in this encounter.    Procedures: Large Joint Inj: L knee on 11/19/2020 1:16 PM Indications: pain and diagnostic evaluation Details: 22 G 1.5 in needle, anteromedial approach  Arthrogram: No  Medications: 5 mL lidocaine (PF) 1 %; 40 mg methylPREDNISolone acetate 40 MG/ML Outcome: tolerated well, no immediate complications Procedure, treatment alternatives, risks and benefits explained, specific risks discussed. Consent was given by the patient. Immediately prior to procedure a time out was called to verify the correct patient, procedure, equipment, support staff and site/side marked as required. Patient was prepped and draped in the usual sterile  fashion.     Clinical Data: No additional findings.  ROS:  All other systems negative, except as noted in the HPI. Review of Systems  Objective: Vital Signs: Ht 5\' 8"  (1.727 m)   Wt 165 lb (74.8 kg)   BMI 25.09 kg/m   Specialty Comments:  No specialty comments available.  PMFS History: Patient Active Problem List   Diagnosis Date Noted   Cervical vertebral fusion 02/20/2017   Past Medical History:  Diagnosis Date   Arthritis    Diabetes mellitus without complication ([facility])    not on meds   Hypertension    has been taken off bp meds    Nerve damage    nerve left hip down to left foot   Sleep apnea    does not use cpap     History reviewed. No pertinent family history.  Past Surgical History:  Procedure Laterality Date   ANTERIOR CERVICAL DECOMP/DISCECTOMY FUSION N/A 02/20/2017   Procedure: CERVICAL THREE-FOUR ANTERIOR CERVICAL DECOMPRESSION/DISCECTOMY FUSION;  Surgeon: Eustace Moore, MD;  Location: [facility];  Service: Neurosurgery;  Laterality: N/A;   APPENDECTOMY     BACK SURGERY     HERNIA REPAIR     neck fusion     nerve stimulor placed     sciatica     Social History   Occupational History   Not on file  Tobacco Use   Smoking status: Every Day    Packs/day: 1.00    Pack years: 0.00    Types: Cigarettes   Smokeless tobacco: Never  Vaping Use   Vaping Use: Never used  Substance and Sexual Activity   Alcohol use: Yes    Comment: occasionally   Drug use: No   Sexual activity: Not on file

## 2020-11-26 DIAGNOSIS — N401 Enlarged prostate with lower urinary tract symptoms: Secondary | ICD-10-CM | POA: Diagnosis not present

## 2020-11-26 DIAGNOSIS — N138 Other obstructive and reflux uropathy: Secondary | ICD-10-CM | POA: Diagnosis not present

## 2020-11-26 DIAGNOSIS — M546 Pain in thoracic spine: Secondary | ICD-10-CM | POA: Diagnosis not present

## 2020-11-26 DIAGNOSIS — N35819 Other urethral stricture, male, unspecified site: Secondary | ICD-10-CM | POA: Diagnosis not present

## 2020-12-07 DIAGNOSIS — E1165 Type 2 diabetes mellitus with hyperglycemia: Secondary | ICD-10-CM | POA: Diagnosis not present

## 2020-12-07 DIAGNOSIS — S22080A Wedge compression fracture of T11-T12 vertebra, initial encounter for closed fracture: Secondary | ICD-10-CM | POA: Diagnosis not present

## 2020-12-07 DIAGNOSIS — I10 Essential (primary) hypertension: Secondary | ICD-10-CM | POA: Diagnosis not present

## 2020-12-07 DIAGNOSIS — F17210 Nicotine dependence, cigarettes, uncomplicated: Secondary | ICD-10-CM | POA: Diagnosis not present

## 2020-12-07 DIAGNOSIS — Z299 Encounter for prophylactic measures, unspecified: Secondary | ICD-10-CM | POA: Diagnosis not present

## 2020-12-07 DIAGNOSIS — I700 Atherosclerosis of aorta: Secondary | ICD-10-CM | POA: Diagnosis not present

## 2020-12-24 ENCOUNTER — Encounter: Payer: Self-pay | Admitting: Orthopedic Surgery

## 2020-12-24 ENCOUNTER — Ambulatory Visit (INDEPENDENT_AMBULATORY_CARE_PROVIDER_SITE_OTHER): Payer: Medicare Other | Admitting: Orthopedic Surgery

## 2020-12-24 DIAGNOSIS — G8929 Other chronic pain: Secondary | ICD-10-CM | POA: Diagnosis not present

## 2020-12-24 DIAGNOSIS — M25562 Pain in left knee: Secondary | ICD-10-CM | POA: Diagnosis not present

## 2020-12-24 NOTE — Progress Notes (Signed)
Office Visit Note   Patient: Jordan Lambert           Date of Birth: 06-09-1956           MRN: [id number] Visit Date: 12/24/2020              Requested by: Monico Blitz, MD [address] [place],  [place] [address] PCP: Monico Blitz, MD  Chief Complaint  Patient presents with   Left Knee - Follow-up      HPI: Patient is a 65 year old gentleman who presents in follow-up status post intra-articular injection of the left knee 4 weeks ago.  He states that this helped for about 2 to 3 days complains of pain over the lateral joint line with mechanical symptoms and weakness.  Assessment & Plan: Visit Diagnoses:  1. Chronic pain of left knee     Plan: Will order an MRI scan to further evaluate the lateral meniscus.  Follow-up after the MRI is obtained.  Follow-Up Instructions: Return in about 2 weeks (around 01/07/2021).   Ortho Exam  Patient is alert, oriented, no adenopathy, well-dressed, normal affect, normal respiratory effort. Examination patient has an antalgic gait.  He has quad atrophy.  There is crepitation in the patellofemoral joint with range of motion.  He is point tender to palpation over the lateral joint line medial joint line is nontender to palpation collaterals and cruciates are stable.  Imaging: No results found. No images are attached to the encounter.  Labs: Lab Results  Component Value Date   HGBA1C 6.0 (H) 01/21/2017     No results found for: ALBUMIN, PREALBUMIN, CBC  No results found for: MG No results found for: VD25OH  No results found for: PREALBUMIN CBC EXTENDED Latest Ref Rng & Units 02/20/2017 01/21/2017  WBC 4.0 - 10.5 K/uL 12.8(H) 12.5(H)  RBC 4.22 - 5.81 MIL/uL 5.30 5.46  HGB 13.0 - 17.0 g/dL 16.6 16.9  HCT 39.0 - 52.0 % 48.6 48.6  PLT 150 - 400 K/uL 242 239  NEUTROABS 1.7 - 7.7 K/uL - 8.8(H)  LYMPHSABS 0.7 - 4.0 K/uL - 2.9     There is no height or weight on file to calculate BMI.  Orders:  No orders of the defined types were placed in  this encounter.  No orders of the defined types were placed in this encounter.    Procedures: No procedures performed  Clinical Data: No additional findings.  ROS:  All other systems negative, except as noted in the HPI. Review of Systems  Objective: Vital Signs: There were no vitals taken for this visit.  Specialty Comments:  No specialty comments available.  PMFS History: Patient Active Problem List   Diagnosis Date Noted   Cervical vertebral fusion 02/20/2017   Past Medical History:  Diagnosis Date   Arthritis    Diabetes mellitus without complication ([facility])    not on meds   Hypertension    has been taken off bp meds    Nerve damage    nerve left hip down to left foot   Sleep apnea    does not use cpap    History reviewed. No pertinent family history.  Past Surgical History:  Procedure Laterality Date   ANTERIOR CERVICAL DECOMP/DISCECTOMY FUSION N/A 02/20/2017   Procedure: CERVICAL THREE-FOUR ANTERIOR CERVICAL DECOMPRESSION/DISCECTOMY FUSION;  Surgeon: Eustace Moore, MD;  Location: [facility];  Service: Neurosurgery;  Laterality: N/A;   APPENDECTOMY     BACK SURGERY     HERNIA REPAIR  neck fusion     nerve stimulor placed     sciatica     Social History   Occupational History   Not on file  Tobacco Use   Smoking status: Every Day    Packs/day: 1.00    Types: Cigarettes   Smokeless tobacco: Never  Vaping Use   Vaping Use: Never used  Substance and Sexual Activity   Alcohol use: Yes    Comment: occasionally   Drug use: No   Sexual activity: Not on file

## 2021-01-13 ENCOUNTER — Ambulatory Visit
Admission: RE | Admit: 2021-01-13 | Discharge: 2021-01-13 | Disposition: A | Discharge status: Home / Self Care | Payer: Medicare Other | Source: Ambulatory Visit | Attending: Orthopedic Surgery | Admitting: Orthopedic Surgery

## 2021-01-13 ENCOUNTER — Other Ambulatory Visit: Payer: Self-pay

## 2021-01-13 DIAGNOSIS — M25562 Pain in left knee: Secondary | ICD-10-CM

## 2021-01-13 DIAGNOSIS — G8929 Other chronic pain: Secondary | ICD-10-CM

## 2021-01-15 ENCOUNTER — Other Ambulatory Visit: Payer: Medicare Other

## 2021-01-15 DIAGNOSIS — M4807 Spinal stenosis, lumbosacral region: Secondary | ICD-10-CM | POA: Diagnosis not present

## 2021-01-15 DIAGNOSIS — Z9689 Presence of other specified functional implants: Secondary | ICD-10-CM | POA: Diagnosis not present

## 2021-01-15 DIAGNOSIS — G894 Chronic pain syndrome: Secondary | ICD-10-CM | POA: Diagnosis not present

## 2021-01-15 DIAGNOSIS — M5136 Other intervertebral disc degeneration, lumbar region: Secondary | ICD-10-CM | POA: Diagnosis not present

## 2021-01-18 ENCOUNTER — Other Ambulatory Visit: Payer: Medicare Other

## 2021-02-15 DIAGNOSIS — K529 Noninfective gastroenteritis and colitis, unspecified: Secondary | ICD-10-CM | POA: Diagnosis not present

## 2021-02-15 DIAGNOSIS — I2510 Atherosclerotic heart disease of native coronary artery without angina pectoris: Secondary | ICD-10-CM | POA: Diagnosis not present

## 2021-02-15 DIAGNOSIS — K5730 Diverticulosis of large intestine without perforation or abscess without bleeding: Secondary | ICD-10-CM | POA: Diagnosis not present

## 2021-02-15 DIAGNOSIS — K4090 Unilateral inguinal hernia, without obstruction or gangrene, not specified as recurrent: Secondary | ICD-10-CM | POA: Diagnosis not present

## 2021-02-15 DIAGNOSIS — R1032 Left lower quadrant pain: Secondary | ICD-10-CM | POA: Diagnosis not present

## 2021-02-15 DIAGNOSIS — I700 Atherosclerosis of aorta: Secondary | ICD-10-CM | POA: Diagnosis not present

## 2021-02-20 DIAGNOSIS — Z299 Encounter for prophylactic measures, unspecified: Secondary | ICD-10-CM | POA: Diagnosis not present

## 2021-02-20 DIAGNOSIS — I10 Essential (primary) hypertension: Secondary | ICD-10-CM | POA: Diagnosis not present

## 2021-02-20 DIAGNOSIS — K2970 Gastritis, unspecified, without bleeding: Secondary | ICD-10-CM | POA: Diagnosis not present

## 2021-02-20 DIAGNOSIS — I700 Atherosclerosis of aorta: Secondary | ICD-10-CM | POA: Diagnosis not present

## 2021-03-13 DIAGNOSIS — Z2821 Immunization not carried out because of patient refusal: Secondary | ICD-10-CM | POA: Diagnosis not present

## 2021-03-13 DIAGNOSIS — I10 Essential (primary) hypertension: Secondary | ICD-10-CM | POA: Diagnosis not present

## 2021-03-13 DIAGNOSIS — Z299 Encounter for prophylactic measures, unspecified: Secondary | ICD-10-CM | POA: Diagnosis not present

## 2021-03-13 DIAGNOSIS — E1165 Type 2 diabetes mellitus with hyperglycemia: Secondary | ICD-10-CM | POA: Diagnosis not present

## 2021-03-13 DIAGNOSIS — F17210 Nicotine dependence, cigarettes, uncomplicated: Secondary | ICD-10-CM | POA: Diagnosis not present

## 2021-03-21 DIAGNOSIS — R079 Chest pain, unspecified: Secondary | ICD-10-CM | POA: Diagnosis not present

## 2021-03-21 DIAGNOSIS — Z8249 Family history of ischemic heart disease and other diseases of the circulatory system: Secondary | ICD-10-CM | POA: Diagnosis not present

## 2021-03-21 DIAGNOSIS — R9431 Abnormal electrocardiogram [ECG] [EKG]: Secondary | ICD-10-CM | POA: Diagnosis not present

## 2021-03-21 DIAGNOSIS — I700 Atherosclerosis of aorta: Secondary | ICD-10-CM | POA: Diagnosis not present

## 2021-03-21 DIAGNOSIS — Z20822 Contact with and (suspected) exposure to covid-19: Secondary | ICD-10-CM | POA: Diagnosis not present

## 2021-03-21 DIAGNOSIS — M542 Cervicalgia: Secondary | ICD-10-CM | POA: Diagnosis not present

## 2021-03-21 DIAGNOSIS — J9811 Atelectasis: Secondary | ICD-10-CM | POA: Diagnosis not present

## 2021-03-21 DIAGNOSIS — R072 Precordial pain: Secondary | ICD-10-CM | POA: Diagnosis not present

## 2021-03-21 DIAGNOSIS — E7800 Pure hypercholesterolemia, unspecified: Secondary | ICD-10-CM | POA: Diagnosis not present

## 2021-03-21 DIAGNOSIS — I10 Essential (primary) hypertension: Secondary | ICD-10-CM | POA: Diagnosis not present

## 2021-03-21 DIAGNOSIS — G629 Polyneuropathy, unspecified: Secondary | ICD-10-CM | POA: Diagnosis not present

## 2021-03-21 DIAGNOSIS — F17200 Nicotine dependence, unspecified, uncomplicated: Secondary | ICD-10-CM | POA: Diagnosis not present

## 2021-03-21 DIAGNOSIS — R0602 Shortness of breath: Secondary | ICD-10-CM | POA: Diagnosis not present

## 2021-03-21 DIAGNOSIS — R61 Generalized hyperhidrosis: Secondary | ICD-10-CM | POA: Diagnosis not present

## 2021-03-28 DIAGNOSIS — F17210 Nicotine dependence, cigarettes, uncomplicated: Secondary | ICD-10-CM | POA: Diagnosis not present

## 2021-03-28 DIAGNOSIS — E7800 Pure hypercholesterolemia, unspecified: Secondary | ICD-10-CM | POA: Diagnosis not present

## 2021-03-28 DIAGNOSIS — E1165 Type 2 diabetes mellitus with hyperglycemia: Secondary | ICD-10-CM | POA: Diagnosis not present

## 2021-03-28 DIAGNOSIS — Z299 Encounter for prophylactic measures, unspecified: Secondary | ICD-10-CM | POA: Diagnosis not present

## 2021-03-28 DIAGNOSIS — R079 Chest pain, unspecified: Secondary | ICD-10-CM | POA: Diagnosis not present

## 2021-03-28 DIAGNOSIS — I10 Essential (primary) hypertension: Secondary | ICD-10-CM | POA: Diagnosis not present

## 2021-04-01 DIAGNOSIS — I6529 Occlusion and stenosis of unspecified carotid artery: Secondary | ICD-10-CM | POA: Diagnosis not present

## 2021-04-01 DIAGNOSIS — R0989 Other specified symptoms and signs involving the circulatory and respiratory systems: Secondary | ICD-10-CM | POA: Diagnosis not present

## 2021-04-01 DIAGNOSIS — R0602 Shortness of breath: Secondary | ICD-10-CM | POA: Diagnosis not present

## 2021-04-09 ENCOUNTER — Encounter: Payer: Self-pay | Admitting: *Deleted

## 2021-04-10 ENCOUNTER — Encounter: Payer: Self-pay | Admitting: *Deleted

## 2021-04-10 ENCOUNTER — Ambulatory Visit (INDEPENDENT_AMBULATORY_CARE_PROVIDER_SITE_OTHER): Payer: Medicare Other | Admitting: Cardiology

## 2021-04-10 ENCOUNTER — Encounter: Payer: Self-pay | Admitting: Cardiology

## 2021-04-10 VITALS — BP 164/100 | HR 66 | Ht 68.0 in | Wt 163.4 lb

## 2021-04-10 DIAGNOSIS — I10 Essential (primary) hypertension: Secondary | ICD-10-CM

## 2021-04-10 DIAGNOSIS — R0789 Other chest pain: Secondary | ICD-10-CM | POA: Diagnosis not present

## 2021-04-10 DIAGNOSIS — I209 Angina pectoris, unspecified: Secondary | ICD-10-CM

## 2021-04-10 DIAGNOSIS — Z299 Encounter for prophylactic measures, unspecified: Secondary | ICD-10-CM | POA: Diagnosis not present

## 2021-04-10 DIAGNOSIS — F17210 Nicotine dependence, cigarettes, uncomplicated: Secondary | ICD-10-CM | POA: Diagnosis not present

## 2021-04-10 DIAGNOSIS — L723 Sebaceous cyst: Secondary | ICD-10-CM | POA: Diagnosis not present

## 2021-04-10 DIAGNOSIS — K5900 Constipation, unspecified: Secondary | ICD-10-CM | POA: Diagnosis not present

## 2021-04-10 DIAGNOSIS — Z01812 Encounter for preprocedural laboratory examination: Secondary | ICD-10-CM

## 2021-04-10 MED ORDER — METOPROLOL TARTRATE 100 MG PO TABS: 100.0000 mg | ORAL_TABLET | Freq: Once | ORAL | 0 refills | Status: DC

## 2021-04-10 MED ORDER — ASPIRIN EC 81 MG PO TBEC: 81.0000 mg | DELAYED_RELEASE_TABLET | Freq: Every day | ORAL | Status: AC

## 2021-04-10 NOTE — Patient Instructions (Addendum)
Medication Instructions:  Continue all current medications.  Labwork: BMET - order given today.  Please do just prior to CT at [facility] (main entrance).    Testing/Procedures: Coronary CTA  Office will contact with results via phone or letter.     Follow-Up: Pending test results  Any Other Special Instructions Will Be Listed Below (If Applicable).   If you need a refill on your cardiac medications before your next appointment, please call your pharmacy.

## 2021-04-10 NOTE — Progress Notes (Signed)
Clinical Summary Jordan Lambert is a 65 y.o.male seen today as a new patietn. Last seen in our office in 02/2017.   1. Chest pain/Abnormal stress test -Lexiscan nuclear stress test at [facility] 01/2017: medium sized area of mild reversibility inferior wall. Inferior hypokinesis. LVEF 46%. 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 TIghtness midchest, +SOB. 9/10 in severity. Not positional. Pain was constant for several hours, better with NG. No recurrent pain. No recurrent SOB   2. 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  1   methocarbamol (ROBAXIN) 500 MG tablet Take 1 tablet (500 mg total) by mouth every 6 (six) hours as needed for muscle spasms. 40 tablet 3   oxyCODONE-acetaminophen (PERCOCET/ROXICET) 5-325 MG tablet Take 1 tablet by mouth every 6 (six) hours.  0   oxyCODONE-acetaminophen (PERCOCET/ROXICET) 5-325 MG tablet Take 1-2 tablets by mouth every 4 (four) hours as needed for moderate pain. 40 tablet 0   No current facility-administered medications for this visit.     Past Surgical History:  Procedure Laterality Date   ANTERIOR CERVICAL DECOMP/DISCECTOMY FUSION N/A 02/20/2017   Procedure: CERVICAL THREE-FOUR ANTERIOR CERVICAL DECOMPRESSION/DISCECTOMY FUSION;  Surgeon: Eustace Moore, MD;   Location: [place];  Service: Neurosurgery;  Laterality: N/A;   APPENDECTOMY     BACK SURGERY     HERNIA REPAIR     neck fusion     nerve stimulor placed     sciatica       No Known Allergies    No family history on file.   Social History Mr. Loll reports that he has been smoking cigarettes. He has been smoking an average of 1 pack per day. He has never used smokeless tobacco. Mr. Mofield reports current alcohol use.   Review of Systems CONSTITUTIONAL: No weight loss, fever, chills, weakness or fatigue.  HEENT: Eyes: No visual loss, blurred vision, double vision or yellow sclerae.No hearing loss, sneezing, congestion, runny nose or sore throat.  SKIN: No rash or itching.  CARDIOVASCULAR: per hpi RESPIRATORY: No shortness of breath, cough or sputum.  GASTROINTESTINAL: No anorexia, nausea, vomiting or diarrhea. No abdominal pain or blood.  GENITOURINARY: No burning on urination, no polyuria NEUROLOGICAL: No headache, dizziness, syncope, paralysis, ataxia, numbness or tingling in the extremities. No change in bowel or bladder control.  MUSCULOSKELETAL: No muscle, back pain, joint pain or stiffness.  LYMPHATICS: No enlarged nodes. No history of splenectomy.  PSYCHIATRIC: No history of depression or anxiety.  ENDOCRINOLOGIC: No reports of sweating, cold or heat intolerance. No polyuria or polydipsia.  Marland Kitchen   Physical Examination Today's Vitals   04/10/21 0850  BP: Marland Kitchen)  164/100  Pulse: 66  SpO2: 98%  Weight: 163 lb 6.4 oz (74.1 kg)  Height: 5\' 8"  (1.727 m)   Body mass index is 24.84 kg/m.  Gen: resting comfortably, no acute distress HEENT: no scleral icterus, pupils equal round and reactive, no palptable cervical adenopathy,  CV: RRR, no mrg, no jvd Resp: Clear to auscultation bilaterally GI: abdomen is soft, non-tender, non-distended, normal bowel sounds, no hepatosplenomegaly MSK: extremities are warm, no edema.  Skin: warm, no rash Neuro:  no focal deficits Psych:  appropriate affect   Diagnostic Studies  02/2017 echo Study Conclusions   - Left ventricle: The cavity size was normal. Wall thickness was    increased in a pattern of mild LVH. Systolic function was normal.    The estimated ejection fraction was in the range of 50% to 55%.    Doppler parameters are consistent with abnormal left ventricular    relaxation (grade 1 diastolic dysfunction).    Assessment and Plan   1. Chest pain - recent episode of chest pain, mixed in description as far as possible cardiac cause - stress test 2018 mild inferior ischemia, recent CT PE with atherosclerosis - will plan for coronary CTA to further evaluate. Continue aspirin and statin - he reports recent echo with pcp, we will request resutls - request EKG from recent [facility] ER visit  2. HTN - elevated today, just too meds. At recent pcp visit per his report was 130s/80s, monitor at this time.    Arnoldo Lenis, M.D.

## 2021-04-10 NOTE — Addendum Note (Signed)
Addended by: Laurine Blazer on: 04/10/2021 09:46 AM   Modules accepted: Orders

## 2021-04-15 DIAGNOSIS — I10 Essential (primary) hypertension: Secondary | ICD-10-CM | POA: Diagnosis not present

## 2021-04-15 DIAGNOSIS — E1165 Type 2 diabetes mellitus with hyperglycemia: Secondary | ICD-10-CM | POA: Diagnosis not present

## 2021-04-15 DIAGNOSIS — F17210 Nicotine dependence, cigarettes, uncomplicated: Secondary | ICD-10-CM | POA: Diagnosis not present

## 2021-04-15 DIAGNOSIS — Z299 Encounter for prophylactic measures, unspecified: Secondary | ICD-10-CM | POA: Diagnosis not present

## 2021-04-15 DIAGNOSIS — R21 Rash and other nonspecific skin eruption: Secondary | ICD-10-CM | POA: Diagnosis not present

## 2021-04-17 ENCOUNTER — Other Ambulatory Visit (HOSPITAL_COMMUNITY)
Admission: RE | Admit: 2021-04-17 | Discharge: 2021-04-17 | Disposition: A | Discharge status: Home / Self Care | Payer: Medicare Other | Source: Ambulatory Visit | Attending: Cardiology | Admitting: Cardiology

## 2021-04-17 DIAGNOSIS — I10 Essential (primary) hypertension: Secondary | ICD-10-CM | POA: Insufficient documentation

## 2021-04-17 DIAGNOSIS — M4807 Spinal stenosis, lumbosacral region: Secondary | ICD-10-CM | POA: Diagnosis not present

## 2021-04-17 DIAGNOSIS — Z01812 Encounter for preprocedural laboratory examination: Secondary | ICD-10-CM | POA: Insufficient documentation

## 2021-04-17 DIAGNOSIS — Z79891 Long term (current) use of opiate analgesic: Secondary | ICD-10-CM | POA: Diagnosis not present

## 2021-04-17 DIAGNOSIS — G894 Chronic pain syndrome: Secondary | ICD-10-CM | POA: Diagnosis not present

## 2021-04-17 DIAGNOSIS — M5136 Other intervertebral disc degeneration, lumbar region: Secondary | ICD-10-CM | POA: Diagnosis not present

## 2021-04-17 LAB — BASIC METABOLIC PANEL
Anion gap: 10 (ref 5–15)
BUN: 13 mg/dL (ref 8–23)
CO2: 27 mmol/L (ref 22–32)
Calcium: 9 mg/dL (ref 8.9–10.3)
Chloride: 101 mmol/L (ref 98–111)
Creatinine, Ser: 1.18 mg/dL (ref 0.61–1.24)
GFR, Estimated: >60 mL/min (ref >60)
Glucose, Bld: 227 mg/dL — ABNORMAL HIGH (ref 70–99)
Potassium: 4 mmol/L (ref 3.5–5.1)
Sodium: 138 mmol/L (ref 135–145)

## 2021-04-19 ENCOUNTER — Telehealth (HOSPITAL_COMMUNITY): Payer: Self-pay | Admitting: *Deleted

## 2021-04-19 NOTE — Telephone Encounter (Signed)
Reaching out to patient to offer assistance regarding upcoming cardiac imaging study; pt verbalizes understanding of appt date/time, parking situation and where to check in, pre-test NPO status and medications ordered, and verified current allergies; name and call back number provided for further questions should they arise  Jordan Clement RN Navigator Cardiac [facility] [facility] [phone] office [phone] cell  Patient states his normal HR is in the 70's-80's and will therefore take 100mg  metoprolol tartrate two hours prior to cardiac CT scan.  He is aware to arrive at 11am for his 11:30am scan.

## 2021-04-22 ENCOUNTER — Encounter: Payer: Self-pay | Admitting: *Deleted

## 2021-04-22 ENCOUNTER — Ambulatory Visit (HOSPITAL_COMMUNITY)
Admission: RE | Admit: 2021-04-22 | Discharge: 2021-04-22 | Disposition: A | Discharge status: Home / Self Care | Payer: Medicare Other | Source: Ambulatory Visit | Attending: Cardiology | Admitting: Cardiology

## 2021-04-22 ENCOUNTER — Telehealth: Payer: Self-pay | Admitting: *Deleted

## 2021-04-22 ENCOUNTER — Other Ambulatory Visit: Payer: Self-pay

## 2021-04-22 DIAGNOSIS — I25119 Atherosclerotic heart disease of native coronary artery with unspecified angina pectoris: Secondary | ICD-10-CM | POA: Diagnosis not present

## 2021-04-22 DIAGNOSIS — I209 Angina pectoris, unspecified: Secondary | ICD-10-CM | POA: Insufficient documentation

## 2021-04-22 DIAGNOSIS — R931 Abnormal findings on diagnostic imaging of heart and coronary circulation: Secondary | ICD-10-CM | POA: Diagnosis not present

## 2021-04-22 IMAGING — CT CT HEART MORP W/ CTA COR W/ SCORE W/ CA W/CM &/OR W/O CM
1 series · 5 of 15 positions shown, 7 images · non-contrast
Comparison: None.
COMPARISON: None.

Addendum:
EXAM:
OVER-READ INTERPRETATION  CT CHEST

The following report is an over-read performed by radiologist Dr.
Darrius Bressler [REDACTED] on 04/22/2021. This
over-read does not include interpretation of cardiac or coronary
anatomy or pathology. The coronary calcium score/coronary CTA
interpretation by the cardiologist is attached.
CLINICAL DATA: 65 Year old White male
Cardiac/Coronary  CTA
TECHNIQUE: The patient was scanned on a Phillips Force scanner.

[Series 414: — · 0.45mm/px · 5 of 15 slices shown, 7 images]
[im 3/15  vessel]
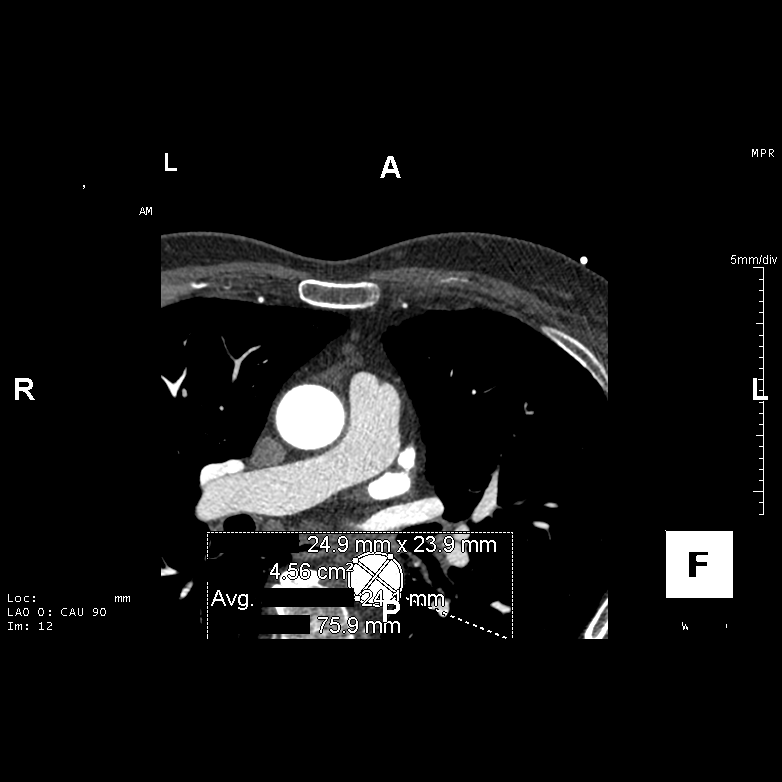
[im 3/15  lung]
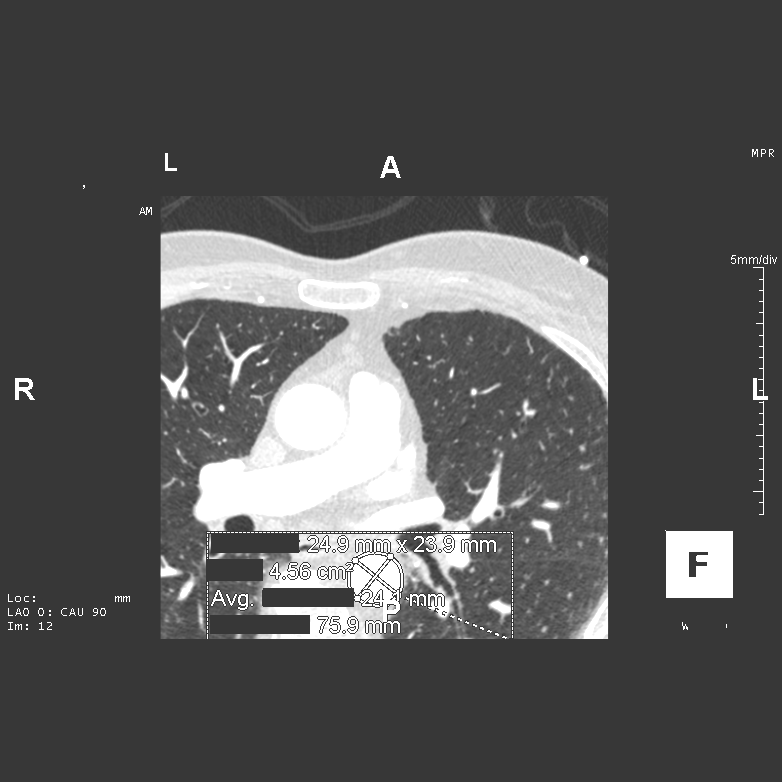
[im 5/15  vessel]
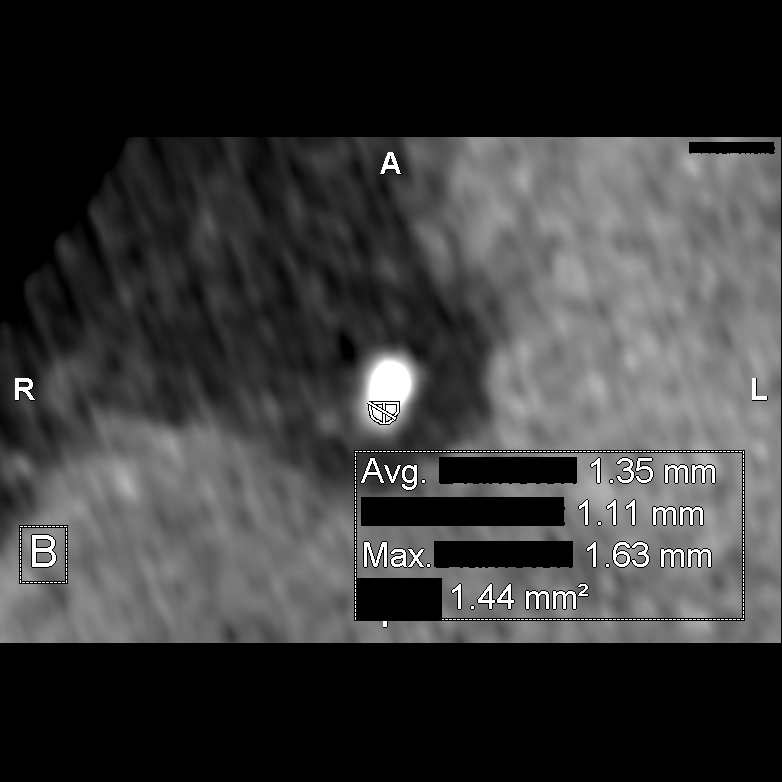
[im 8/15  vessel]
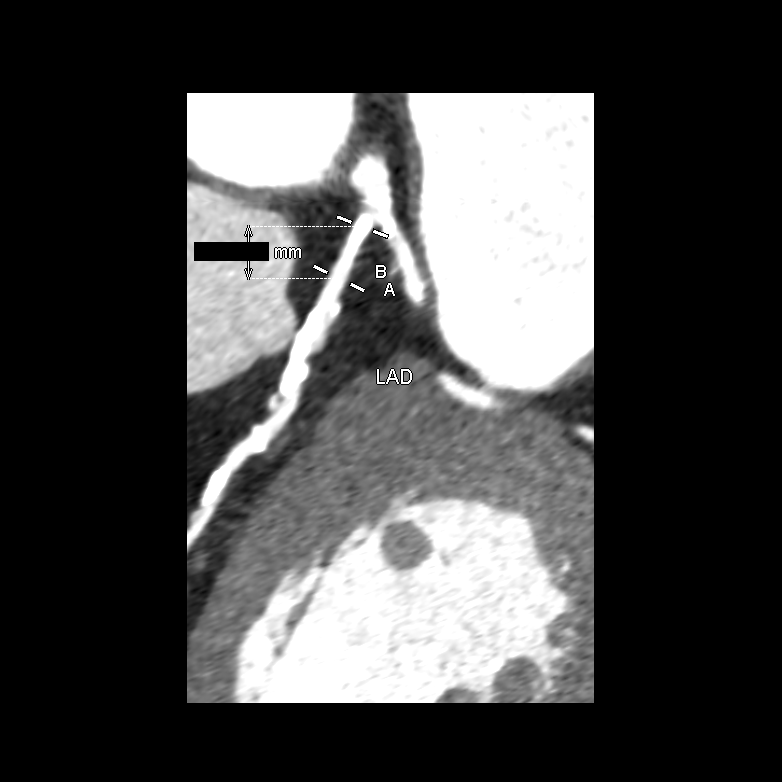
[im 11/15  vessel]
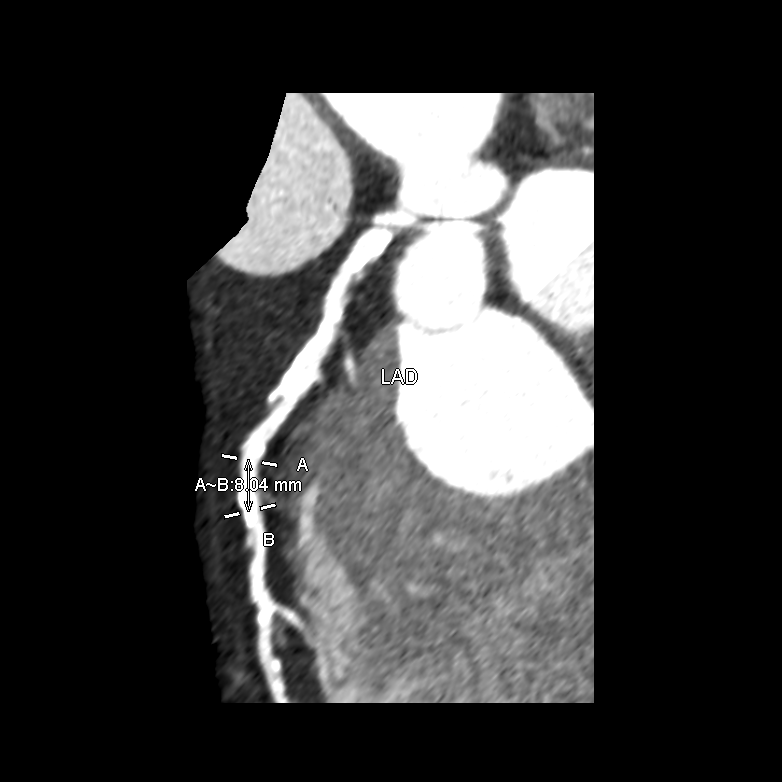
[im 13/15  vessel]
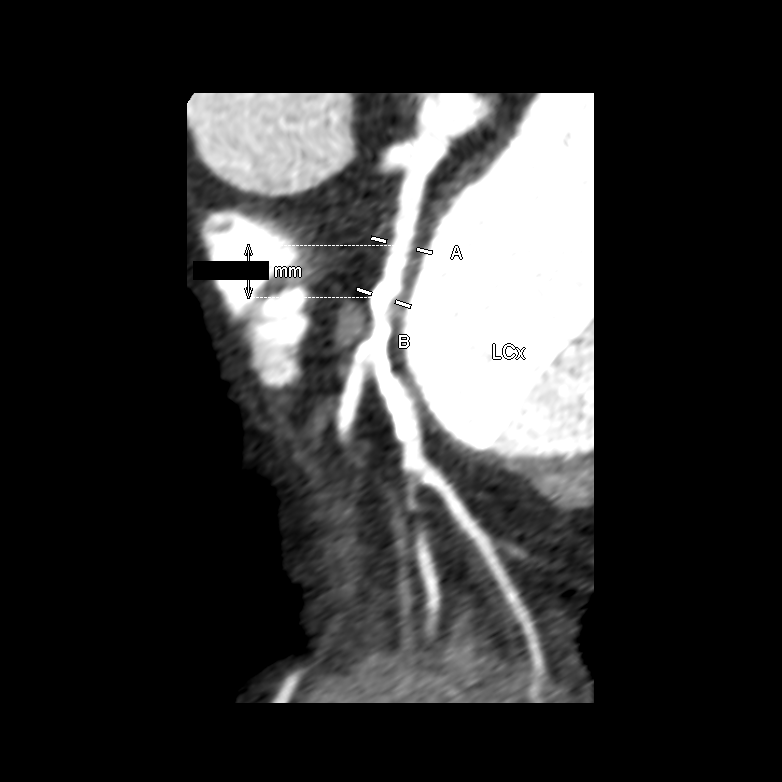
[im 13/15  lung]
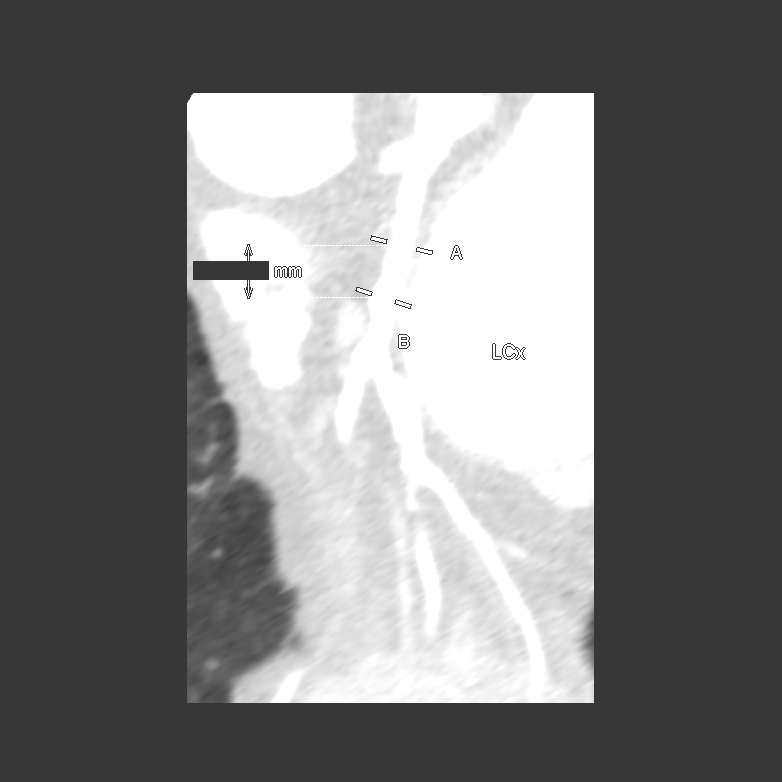

[5 of 15 positions shown; findings below may reference images not displayed]

FINDINGS: Within the visualized portions of the thorax there are no suspicious
appearing pulmonary nodules or masses, there is no acute
consolidative airspace disease, no pleural effusions, no
pneumothorax and no lymphadenopathy. Visualized portions of the
upper abdomen are unremarkable. There are no aggressive appearing
lytic or blastic lesions noted in the visualized portions of the
skeleton. Spinal cord stimulator noted in the midthoracic region.
Post vertebroplasty changes of a lower thoracic vertebral body
incidentally noted.
IMPRESSION: 1. No significant incidental noncardiac findings are noted.
FINDINGS: Scan was triggered in the descending thoracic aorta. Axial
non-contrast 3 mm slices were carried out through the heart. The
data set was analyzed on a dedicated work station and scored using
the Agatson method. Gantry rotation speed was 250 msecs and
collimation was .6 mm. 0.8 mg of sl NTG was given. The 3D data set
was reconstructed in 5% intervals of the 67-82 % of the R-R cycle.
Diastolic phases were analyzed on a dedicated work station using
MPR, MIP and VRT modes. The patient received 95 cc of contrast.

Aorta:  Normal size.  No calcifications.  No dissection.

Main Pulmonary Artery: Normal size of the pulmonary artery.

Aortic Valve:  Tri-leaflet.  No calcifications.

Coronary Arteries:  Normal coronary origin.  Right dominance.

Coronary Calcium Score:

Left main: 68

Left anterior descending artery: 589

Left circumflex artery: 336

Right coronary artery: 321

Total: 9090

Percentile: 94th for age, sex, and race matched control.

RCA is a large dominant artery that gives rise to PDA and PLA. There
is 50-70% moderate stenosis calcified plaque in the mid RCA.

Left main is a large artery that gives rise to LAD and LCX arteries.
There is a minimal distal lesions that leads into ostial LAD and LCX
lesions.

LAD is a large vessel that gives rise to one large D1 Branch. There
is a 25-49% mild calcified plaque in the ostial LAD. There is a
moderate stenosis calcified plaque in the proximal and mid LAD with
soft plaque mild obstruction distal to the mLAD moderate stenosis
and distal LAD calcifications. There is a moderate stenosis in the
D1 and mild non obstructive disease in the diagonal body.

LCX is a non-dominant artery that gives rise to two OM branches.
There is a minimal non obstructive mixed plaque proximal lesion and
a calcified moderate stenosis at the OM1 take off and into the mid
LCX. Luminal irregularities in the OM1.

Other findings:

Normal pulmonary vein drainage into the left atrium.

Normal left atrial appendage without a thrombus.

Extra-cardiac findings: See attached radiology report for
non-cardiac structures.

Slab artifact noted.
IMPRESSION: 1. Coronary calcium score of 9090. This was 94th percentile for age,
sex, and race matched control.

2. Normal coronary origin with right dominance.

3. CAD-RADS 3. Moderate stenosis. Consider symptom-guided
anti-ischemic pharmacotherapy as well as risk factor modification
per guideline directed care. Additional analysis with CT FFR will be
submitted.

RECOMMENDATIONS:



If CAC = 0, it is reasonable to withhold statin therapy and reassess
in 5 to 10 years, as long as higher risk conditions are absent
(diabetes mellitus, family history of premature CHD in first degree
relatives (males <55 years; females <65 years), cigarette smoking,
LDL >=190 mg/dL or other independent risk factors).

If CAC is 1 to 99, it is reasonable to initiate statin therapy for
patients >=55 years of age.

If CAC is >=100 or >=75th percentile, it is reasonable to initiate
statin therapy at any age.

Cardiology referral should be considered for patients with CAC
scores =400 or >=75th percentile.

*4542 AHA/ACC/AACVPR/AAPA/ABC/ISAAC/HUNEAULT/BAZAN/[place]/LOVEIKIS/MILLINER/TIGER
Guideline on the Management of Blood Cholesterol: A Report of the
American College of Cardiology/American Heart Association Task Force
on Clinical Practice Guidelines. J Am Coll Cardiol.
5304;73(24):8887-8545.

*** End of Addendum ***
EXAM:
OVER-READ INTERPRETATION  CT CHEST

The following report is an over-read performed by radiologist Dr.
Darrius Bressler [REDACTED] on 04/22/2021. This
over-read does not include interpretation of cardiac or coronary
anatomy or pathology. The coronary calcium score/coronary CTA
interpretation by the cardiologist is attached.
FINDINGS: Within the visualized portions of the thorax there are no suspicious
appearing pulmonary nodules or masses, there is no acute
consolidative airspace disease, no pleural effusions, no
pneumothorax and no lymphadenopathy. Visualized portions of the
upper abdomen are unremarkable. There are no aggressive appearing
lytic or blastic lesions noted in the visualized portions of the
skeleton. Spinal cord stimulator noted in the midthoracic region.
Post vertebroplasty changes of a lower thoracic vertebral body
incidentally noted.
IMPRESSION: 1. No significant incidental noncardiac findings are noted.

## 2021-04-22 MED ORDER — NITROGLYCERIN 0.4 MG SL SUBL
0.8000 mg | SUBLINGUAL_TABLET | Freq: Once | SUBLINGUAL | Status: AC
  Administered 2021-04-22: 0.8 mg via SUBLINGUAL

## 2021-04-22 MED ORDER — NITROGLYCERIN 0.4 MG SL SUBL
SUBLINGUAL_TABLET | SUBLINGUAL | Status: AC
  Filled 2021-04-22: qty 2

## 2021-04-22 MED ORDER — IOHEXOL 350 MG/ML SOLN
95.0000 mL | Freq: Once | INTRAVENOUS | Status: AC | PRN
  Administered 2021-04-22: 95 mL via INTRAVENOUS

## 2021-04-22 NOTE — Telephone Encounter (Signed)
Laurine Blazer, LPN  83/29/1916  6:06 PM EST Back to Top    Patient notified via letter.  Copy to pcp.

## 2021-04-22 NOTE — Telephone Encounter (Signed)
-----   Message from Arnoldo Lenis, MD sent at 04/19/2021 11:30 AM EST ----- Normal labs   Zandra Abts MD

## 2021-04-24 DIAGNOSIS — L089 Local infection of the skin and subcutaneous tissue, unspecified: Secondary | ICD-10-CM | POA: Diagnosis not present

## 2021-04-24 DIAGNOSIS — L723 Sebaceous cyst: Secondary | ICD-10-CM | POA: Diagnosis not present

## 2021-04-24 DIAGNOSIS — Z6824 Body mass index (BMI) 24.0-24.9, adult: Secondary | ICD-10-CM | POA: Diagnosis not present

## 2021-04-24 DIAGNOSIS — D126 Benign neoplasm of colon, unspecified: Secondary | ICD-10-CM | POA: Diagnosis not present

## 2021-05-06 ENCOUNTER — Telehealth: Payer: Self-pay | Admitting: *Deleted

## 2021-05-06 NOTE — Telephone Encounter (Signed)
Laurine Blazer, LPN  94/70/9628  3:66 AM EST Back to Top    Notified, copy to pcp.  Telephone call scheduled for tomorrow at 2:00 with Dr. Harl Bowie.

## 2021-05-06 NOTE — Telephone Encounter (Signed)
The patient verbally consented for a telehealth phone visit with [facility] and understands that his/her insurance company will be billed for the encounter.  

## 2021-05-06 NOTE — Telephone Encounter (Signed)
-----   Message from Arnoldo Lenis, MD sent at 05/06/2021  9:06 AM EST ----- CT scan does suggest some possible blockage in the arteries of the heart. Can he do a virtual appointment tomorrow 2pm open slot to discuss findings  Zandra Abts MD

## 2021-05-07 ENCOUNTER — Encounter: Payer: Self-pay | Admitting: Cardiology

## 2021-05-07 ENCOUNTER — Ambulatory Visit (INDEPENDENT_AMBULATORY_CARE_PROVIDER_SITE_OTHER): Payer: Medicare Other | Admitting: Cardiology

## 2021-05-07 VITALS — BP 147/72 | HR 79 | Ht 68.0 in | Wt 162.0 lb

## 2021-05-07 DIAGNOSIS — R0789 Other chest pain: Secondary | ICD-10-CM

## 2021-05-07 NOTE — Progress Notes (Signed)
Virtual Visit via Telephone Note   This visit type was conducted due to national recommendations for restrictions regarding the COVID-19 Pandemic (e.g. social distancing) in an effort to limit this patient's exposure and mitigate transmission in our community.  Due to his co-morbid illnesses, this patient is at least at moderate risk for complications without adequate follow up.  This format is felt to be most appropriate for this patient at this time.  The patient did not have access to video technology/had technical difficulties with video requiring transitioning to audio format only (telephone).  All issues noted in this document were discussed and addressed.  No physical exam could be performed with this format.  Please refer to the patient's chart for his  consent to telehealth for [facility].    Date:  05/07/2021   ID:  Jordan Lambert, DOB [birth year], MRN [id number] The patient was identified using 2 identifiers.  Patient Location: Home Provider Location: Office/Clinic   PCP:  Monico Blitz, [place] [facility] Cardiologist:  Carlyle Dolly, MD {   Evaluation Performed:  Follow-Up Visit  Chief Complaint:  Follow up visit  History of Present Illness:    Jordan Lambert is a 65 y.o. male seen today for follow up of the following medical problems.    1. Chest pain/Abnormal stress test -Lexiscan nuclear stress test at [facility] 01/2017: medium sized area of mild reversibility inferior wall. Inferior hypokinesis. LVEF 46%. 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 TIghtness midchest, +SOB. 9/10 in severity. Not positional. Pain was constant for  several hours, better with NG. No recurrent pain. No recurrent SOB  03/2021 echo pcp: LVEF 70%, grade I dd - sedentary lifestyle due to prior back injury   2. HTN - compliant with meds     The patient does not have symptoms concerning for COVID-19 infection (fever, chills, cough, or new shortness of breath).    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     Allergies:   Sulfamethoxazole-trimethoprim   Social History   Tobacco Use   Smoking status: Every Day    Packs/day: 1.00    Types: Cigarettes   Smokeless tobacco: Never  Vaping Use   Vaping Use: Never  used  Substance Use Topics   Alcohol use: Yes    Comment: occasionally   Drug use: No     Family Hx: The patient's family history is not on file.  ROS:   Please see the history of present illness.     All other systems reviewed and are negative.   Prior CV studies:   The following studies were reviewed today:  02/2017 echo Study Conclusions   - Left ventricle: The cavity size was normal. Wall thickness was    increased in a pattern of mild LVH. Systolic function was normal.    The estimated ejection fraction was in the range of 50% to 55%.    Doppler parameters are consistent with  abnormal left ventricular    relaxation (grade 1 diastolic dysfunction).   04/2021 coronary CTA RCA is a large dominant artery that gives rise to PDA and PLA. There is 50-70% moderate stenosis calcified plaque in the mid RCA.   Left main is a large artery that gives rise to LAD and LCX arteries. There is a minimal distal lesions that leads into ostial LAD and LCX lesions.   LAD is a large vessel that gives rise to one large D1 [place]. There is a 25-49% mild calcified plaque in the ostial LAD. There is a moderate stenosis calcified plaque in the proximal and mid LAD with soft plaque mild obstruction distal to the mLAD moderate stenosis and distal LAD calcifications. There is a moderate stenosis in the D1 and mild non obstructive disease in the diagonal body.   LCX is a non-dominant artery that gives rise to two OM branches. There is a minimal non obstructive mixed plaque proximal lesion and a calcified moderate stenosis at the OM1 take off and into the mid LCX. Luminal irregularities in the OM1.   IMPRESSION: 1. Coronary calcium score of 1313. This was 94th percentile for age, sex, and race matched control.   2. Normal coronary origin with right dominance.   3. CAD-RADS 3. Moderate stenosis. Consider symptom-guided anti-ischemic pharmacotherapy as well as risk factor modification per guideline directed care. Additional analysis with CT FFR will be submitted.  FFR 1. Left Main: No significant functional stenosis.   2. LAD: significant functional stenosis, CT-FFR 0.83-> 0.77 at mLAD lesion. Ostial and Proximal LAD lesions do not show significant functional stenosis. 3. LCX: No significant functional stenosis, at [facility], CT-FFR 0.89-> 0.85. 4. RCA: Modeled as CTO after mRCA lesion.   IMPRESSION: 1. CT FFR analysis shows evidence of significant functional stenosis.       Labs/Other Tests and Data Reviewed:    EKG:  No ECG reviewed.  Recent Labs: 04/17/2021: BUN 13;  Creatinine, Ser 1.18; Potassium 4.0; Sodium 138   Recent Lipid Panel No results found for: CHOL, TRIG, HDL, CHOLHDL, LDLCALC, LDLDIRECT  Wt Readings from Last 3 Encounters:  04/10/21 163 lb 6.4 oz (74.1 kg)  11/19/20 165 lb (74.8 kg)  12/28/18 170 lb (77.1 kg)     Risk Assessment/Calculations:         Objective:    Vital Signs:   Today's Vitals   05/07/21 1335  BP: (!) 147/72  Pulse: 79  Weight: 162 lb (73.5 kg)  Height: 5\' 8"  (1.727 m)   Body mass index is 24.63 kg/m. Normal affect. Normal speech pattern and tone Comfortable, no apparent distress. No audible signs of sob or wheezing .  ASSESSMENT & PLAN:    1. Chest pain - recent episode of chest pain, mixed in  description as far as possible cardiac cause - stress test 2018 mild inferior ischemia, recent CT PE with atherosclerosis - coronary CTA shows significant LAD disease, RCA CTO - will plan for cath        Shared Decision Making/Informed Consent The risks [stroke (1 in 1000), death (1 in 1000), kidney failure [usually temporary] (1 in 500), bleeding (1 in 200), allergic reaction [possibly serious] (1 in 200)], benefits (diagnostic support and management of coronary artery disease) and alternatives of a cardiac catheterization were discussed in detail with Mr. Biello and he is willing to proceed.    COVID-19 Education: The signs and symptoms of COVID-19 were discussed with the patient and how to seek care for testing (follow up with PCP or arrange E-visit).  The importance of social distancing was discussed today.  Time:   Today, I have spent 18 minutes with the patient with telehealth technology discussing the above problems.     Medication Adjustments/Labs and Tests Ordered: Current medicines are reviewed at length with the patient today.  Concerns regarding medicines are outlined above.   Tests Ordered: No orders of the defined types were placed in this encounter.   Medication Changes: No orders of the  defined types were placed in this encounter.   Follow Up:  1 month  Signed, Carlyle Dolly, MD  05/07/2021 11:13 AM    [facility]

## 2021-05-08 ENCOUNTER — Telehealth: Payer: Self-pay | Admitting: Cardiology

## 2021-05-08 ENCOUNTER — Other Ambulatory Visit: Payer: Self-pay | Admitting: Cardiology

## 2021-05-08 DIAGNOSIS — Z20828 Contact with and (suspected) exposure to other viral communicable diseases: Secondary | ICD-10-CM | POA: Diagnosis not present

## 2021-05-08 DIAGNOSIS — I10 Essential (primary) hypertension: Secondary | ICD-10-CM

## 2021-05-08 DIAGNOSIS — Z01812 Encounter for preprocedural laboratory examination: Secondary | ICD-10-CM

## 2021-05-08 DIAGNOSIS — R079 Chest pain, unspecified: Secondary | ICD-10-CM

## 2021-05-08 MED ORDER — SODIUM CHLORIDE 0.9% FLUSH: 3.0000 mL | Freq: Two times a day (BID) | INTRAVENOUS | Status: DC

## 2021-05-08 NOTE — Telephone Encounter (Signed)
Mr. Robey called requesting to speak with Edd Fabian in regards to what time is his arrival time for his heart cath.

## 2021-05-08 NOTE — Telephone Encounter (Signed)
PERCERT:  Left heart cath scheduled for Wednesday, 12/7 at 8:30 am with Irish Lack

## 2021-05-09 ENCOUNTER — Encounter: Payer: Self-pay | Admitting: *Deleted

## 2021-05-09 NOTE — Telephone Encounter (Signed)
Patient notified and verbalized understanding.   Nurse visit scheduled for tomorrow morning at 9:00 am for EKG.    Instructions & lab orders will be given at that time.

## 2021-05-09 NOTE — Telephone Encounter (Signed)
Left heart cath scheduled for Wednesday, 05/15/2021 at 8:30 am with Dr. Irish Lack.  Patient will need to arrive to short stay at [facility] at 6:30 am.    Labs - will need BMET, CBC  Needs EKG as not done since 03/21/2021.

## 2021-05-10 ENCOUNTER — Ambulatory Visit (INDEPENDENT_AMBULATORY_CARE_PROVIDER_SITE_OTHER): Payer: Medicare Other | Admitting: *Deleted

## 2021-05-10 ENCOUNTER — Other Ambulatory Visit: Payer: Self-pay

## 2021-05-10 DIAGNOSIS — R079 Chest pain, unspecified: Secondary | ICD-10-CM | POA: Diagnosis not present

## 2021-05-10 DIAGNOSIS — Z01812 Encounter for preprocedural laboratory examination: Secondary | ICD-10-CM | POA: Diagnosis not present

## 2021-05-10 DIAGNOSIS — I10 Essential (primary) hypertension: Secondary | ICD-10-CM | POA: Diagnosis not present

## 2021-05-10 NOTE — Progress Notes (Signed)
Patient in office for ekg - needed for cath scheduled on 05/15/2021.

## 2021-05-11 ENCOUNTER — Emergency Department (HOSPITAL_COMMUNITY): Payer: Medicare Other

## 2021-05-11 ENCOUNTER — Inpatient Hospital Stay (HOSPITAL_COMMUNITY)
Admission: EM | Admit: 2021-05-11 | Discharge: 2021-05-14 | DRG: 247 | Disposition: A | Discharge status: Home / Self Care | Payer: Medicare Other | Attending: Internal Medicine | Admitting: Internal Medicine

## 2021-05-11 ENCOUNTER — Inpatient Hospital Stay (HOSPITAL_COMMUNITY): Payer: Medicare Other

## 2021-05-11 ENCOUNTER — Encounter (HOSPITAL_COMMUNITY): Payer: Self-pay

## 2021-05-11 ENCOUNTER — Other Ambulatory Visit: Payer: Self-pay

## 2021-05-11 DIAGNOSIS — I200 Unstable angina: Secondary | ICD-10-CM | POA: Diagnosis not present

## 2021-05-11 DIAGNOSIS — R001 Bradycardia, unspecified: Secondary | ICD-10-CM | POA: Diagnosis present

## 2021-05-11 DIAGNOSIS — G8929 Other chronic pain: Secondary | ICD-10-CM | POA: Diagnosis present

## 2021-05-11 DIAGNOSIS — Z955 Presence of coronary angioplasty implant and graft: Secondary | ICD-10-CM

## 2021-05-11 DIAGNOSIS — Z720 Tobacco use: Secondary | ICD-10-CM

## 2021-05-11 DIAGNOSIS — I208 Other forms of angina pectoris: Secondary | ICD-10-CM

## 2021-05-11 DIAGNOSIS — D72829 Elevated white blood cell count, unspecified: Secondary | ICD-10-CM | POA: Diagnosis not present

## 2021-05-11 DIAGNOSIS — I209 Angina pectoris, unspecified: Secondary | ICD-10-CM

## 2021-05-11 DIAGNOSIS — G4733 Obstructive sleep apnea (adult) (pediatric): Secondary | ICD-10-CM | POA: Diagnosis present

## 2021-05-11 DIAGNOSIS — Z79899 Other long term (current) drug therapy: Secondary | ICD-10-CM | POA: Diagnosis not present

## 2021-05-11 DIAGNOSIS — J439 Emphysema, unspecified: Secondary | ICD-10-CM | POA: Diagnosis present

## 2021-05-11 DIAGNOSIS — Z7982 Long term (current) use of aspirin: Secondary | ICD-10-CM | POA: Diagnosis not present

## 2021-05-11 DIAGNOSIS — I25110 Atherosclerotic heart disease of native coronary artery with unstable angina pectoris: Secondary | ICD-10-CM | POA: Diagnosis present

## 2021-05-11 DIAGNOSIS — Z981 Arthrodesis status: Secondary | ICD-10-CM

## 2021-05-11 DIAGNOSIS — R9431 Abnormal electrocardiogram [ECG] [EKG]: Secondary | ICD-10-CM | POA: Diagnosis not present

## 2021-05-11 DIAGNOSIS — Z882 Allergy status to sulfonamides status: Secondary | ICD-10-CM | POA: Diagnosis not present

## 2021-05-11 DIAGNOSIS — R000 Tachycardia, unspecified: Secondary | ICD-10-CM | POA: Diagnosis not present

## 2021-05-11 DIAGNOSIS — I700 Atherosclerosis of aorta: Secondary | ICD-10-CM | POA: Diagnosis present

## 2021-05-11 DIAGNOSIS — E119 Type 2 diabetes mellitus without complications: Secondary | ICD-10-CM | POA: Diagnosis present

## 2021-05-11 DIAGNOSIS — I358 Other nonrheumatic aortic valve disorders: Secondary | ICD-10-CM | POA: Diagnosis present

## 2021-05-11 DIAGNOSIS — E440 Moderate protein-calorie malnutrition: Secondary | ICD-10-CM | POA: Diagnosis present

## 2021-05-11 DIAGNOSIS — R079 Chest pain, unspecified: Secondary | ICD-10-CM | POA: Diagnosis not present

## 2021-05-11 DIAGNOSIS — I10 Essential (primary) hypertension: Secondary | ICD-10-CM

## 2021-05-11 DIAGNOSIS — E876 Hypokalemia: Secondary | ICD-10-CM | POA: Diagnosis not present

## 2021-05-11 DIAGNOSIS — I110 Hypertensive heart disease with heart failure: Secondary | ICD-10-CM | POA: Diagnosis present

## 2021-05-11 DIAGNOSIS — E785 Hyperlipidemia, unspecified: Secondary | ICD-10-CM

## 2021-05-11 DIAGNOSIS — G629 Polyneuropathy, unspecified: Secondary | ICD-10-CM | POA: Diagnosis present

## 2021-05-11 DIAGNOSIS — M542 Cervicalgia: Secondary | ICD-10-CM | POA: Diagnosis present

## 2021-05-11 DIAGNOSIS — F17210 Nicotine dependence, cigarettes, uncomplicated: Secondary | ICD-10-CM | POA: Diagnosis present

## 2021-05-11 DIAGNOSIS — I5022 Chronic systolic (congestive) heart failure: Secondary | ICD-10-CM | POA: Diagnosis present

## 2021-05-11 DIAGNOSIS — R519 Headache, unspecified: Secondary | ICD-10-CM | POA: Diagnosis not present

## 2021-05-11 DIAGNOSIS — Z6824 Body mass index (BMI) 24.0-24.9, adult: Secondary | ICD-10-CM | POA: Diagnosis not present

## 2021-05-11 DIAGNOSIS — Z20822 Contact with and (suspected) exposure to covid-19: Secondary | ICD-10-CM | POA: Diagnosis present

## 2021-05-11 LAB — BASIC METABOLIC PANEL
Anion gap: 8 (ref 5–15)
BUN: 7 mg/dL — ABNORMAL LOW (ref 8–23)
CO2: 24 mmol/L (ref 22–32)
Calcium: 9.4 mg/dL (ref 8.9–10.3)
Chloride: 105 mmol/L (ref 98–111)
Creatinine, Ser: 0.81 mg/dL (ref 0.61–1.24)
GFR, Estimated: >60 mL/min (ref >60)
Glucose, Bld: 151 mg/dL — ABNORMAL HIGH (ref 70–99)
Potassium: 3.9 mmol/L (ref 3.5–5.1)
Sodium: 137 mmol/L (ref 135–145)

## 2021-05-11 LAB — HEPARIN LEVEL (UNFRACTIONATED)
Heparin Unfractionated: 0.27 IU/mL — ABNORMAL LOW (ref 0.30–0.70)
Heparin Unfractionated: 0.26 IU/mL — ABNORMAL LOW (ref 0.30–0.70)

## 2021-05-11 LAB — CBC
HCT: 43.4 % (ref 39.0–52.0)
Hemoglobin: 14.6 g/dL (ref 13.0–17.0)
MCH: 30.9 pg (ref 26.0–34.0)
MCHC: 33.6 g/dL (ref 30.0–36.0)
MCV: 91.8 fL (ref 80.0–100.0)
Platelets: 235 10*3/uL (ref 150–400)
RBC: 4.73 MIL/uL (ref 4.22–5.81)
RDW: 12.2 % (ref 11.5–15.5)
WBC: 10.2 10*3/uL (ref 4.0–10.5)
nRBC: 0 % (ref 0.0–0.2)

## 2021-05-11 LAB — GLUCOSE, CAPILLARY: Glucose-Capillary: 98 mg/dL (ref 70–99)

## 2021-05-11 LAB — HIV ANTIBODY (ROUTINE TESTING W REFLEX): HIV Screen 4th Generation wRfx: NONREACTIVE

## 2021-05-11 LAB — TROPONIN I (HIGH SENSITIVITY)
Troponin I (High Sensitivity): 6 ng/L (ref <18)
Troponin I (High Sensitivity): 6 ng/L (ref <18)

## 2021-05-11 LAB — RESP PANEL BY RT-PCR (FLU A&B, COVID) ARPGX2
Influenza A by PCR: NEGATIVE
Influenza B by PCR: NEGATIVE
SARS Coronavirus 2 by RT PCR: NEGATIVE

## 2021-05-11 MED ORDER — ROSUVASTATIN CALCIUM 20 MG PO TABS
20.0000 mg | ORAL_TABLET | Freq: Every day | ORAL | Status: DC
  Administered 2021-05-11: 20 mg via ORAL
  Filled 2021-05-11: qty 1

## 2021-05-11 MED ORDER — ASPIRIN 81 MG PO CHEW
324.0000 mg | CHEWABLE_TABLET | ORAL | Status: AC
  Administered 2021-05-11: 324 mg via ORAL
  Filled 2021-05-11: qty 4

## 2021-05-11 MED ORDER — IOHEXOL 350 MG/ML SOLN
100.0000 mL | Freq: Once | INTRAVENOUS | Status: AC | PRN
  Administered 2021-05-11: 100 mL via INTRAVENOUS

## 2021-05-11 MED ORDER — ROSUVASTATIN CALCIUM 20 MG PO TABS
40.0000 mg | ORAL_TABLET | Freq: Every day | ORAL | Status: DC
  Administered 2021-05-12 – 2021-05-14 (×3): 40 mg via ORAL
  Filled 2021-05-11 (×3): qty 2

## 2021-05-11 MED ORDER — ASPIRIN EC 81 MG PO TBEC: 81.0000 mg | DELAYED_RELEASE_TABLET | Freq: Every day | ORAL | Status: DC

## 2021-05-11 MED ORDER — LISINOPRIL 10 MG PO TABS
10.0000 mg | ORAL_TABLET | Freq: Every day | ORAL | Status: DC
  Administered 2021-05-11 – 2021-05-14 (×4): 10 mg via ORAL
  Filled 2021-05-11 (×4): qty 1

## 2021-05-11 MED ORDER — NITROGLYCERIN 0.4 MG SL SUBL
0.4000 mg | SUBLINGUAL_TABLET | SUBLINGUAL | Status: DC | PRN
  Administered 2021-05-11 (×3): 0.4 mg via SUBLINGUAL
  Filled 2021-05-11 (×2): qty 1

## 2021-05-11 MED ORDER — TRAZODONE HCL 100 MG PO TABS
100.0000 mg | ORAL_TABLET | Freq: Every day | ORAL | Status: DC
  Administered 2021-05-11 – 2021-05-13 (×3): 100 mg via ORAL
  Filled 2021-05-11 (×3): qty 1

## 2021-05-11 MED ORDER — ACETAMINOPHEN 325 MG PO TABS
650.0000 mg | ORAL_TABLET | ORAL | Status: DC | PRN
  Administered 2021-05-12 – 2021-05-13 (×2): 650 mg via ORAL
  Filled 2021-05-11 (×2): qty 2

## 2021-05-11 MED ORDER — ASPIRIN 300 MG RE SUPP: 300.0000 mg | RECTAL | Status: AC

## 2021-05-11 MED ORDER — HYDROMORPHONE HCL 2 MG PO TABS
4.0000 mg | ORAL_TABLET | Freq: Four times a day (QID) | ORAL | Status: DC | PRN
  Administered 2021-05-12 – 2021-05-14 (×8): 4 mg via ORAL
  Filled 2021-05-11 (×8): qty 2

## 2021-05-11 MED ORDER — ACETAMINOPHEN 500 MG PO TABS
1000.0000 mg | ORAL_TABLET | Freq: Four times a day (QID) | ORAL | Status: DC | PRN
  Administered 2021-05-11 – 2021-05-12 (×2): 1000 mg via ORAL
  Filled 2021-05-11 (×2): qty 2

## 2021-05-11 MED ORDER — HEPARIN BOLUS VIA INFUSION
1000.0000 [IU] | Freq: Once | INTRAVENOUS | Status: AC
  Administered 2021-05-11: 1000 [IU] via INTRAVENOUS
  Filled 2021-05-11: qty 1000

## 2021-05-11 MED ORDER — NITROGLYCERIN IN D5W 200-5 MCG/ML-% IV SOLN
0.0000 ug/min | INTRAVENOUS | Status: DC
  Administered 2021-05-11: 5 ug/min via INTRAVENOUS
  Administered 2021-05-13: 30 ug/min via INTRAVENOUS
  Filled 2021-05-11: qty 250

## 2021-05-11 MED ORDER — HEPARIN (PORCINE) 25000 UT/250ML-% IV SOLN
1100.0000 [IU]/h | INTRAVENOUS | Status: DC
  Administered 2021-05-11: 900 [IU]/h via INTRAVENOUS
  Administered 2021-05-12: 05:00:00 1050 [IU]/h via INTRAVENOUS
  Administered 2021-05-13: 1100 [IU]/h via INTRAVENOUS
  Filled 2021-05-11 (×3): qty 250

## 2021-05-11 MED ORDER — ASPIRIN EC 81 MG PO TBEC
81.0000 mg | DELAYED_RELEASE_TABLET | Freq: Every day | ORAL | Status: DC
  Administered 2021-05-12 – 2021-05-14 (×3): 81 mg via ORAL
  Filled 2021-05-11 (×4): qty 1

## 2021-05-11 MED ORDER — ONDANSETRON HCL 4 MG/2ML IJ SOLN: 4.0000 mg | Freq: Four times a day (QID) | INTRAMUSCULAR | Status: DC | PRN

## 2021-05-11 MED ORDER — HEPARIN BOLUS VIA INFUSION
4000.0000 [IU] | Freq: Once | INTRAVENOUS | Status: AC
  Administered 2021-05-11: 4000 [IU] via INTRAVENOUS
  Filled 2021-05-11: qty 4000

## 2021-05-11 MED ORDER — ATORVASTATIN CALCIUM 40 MG PO TABS: 40.0000 mg | ORAL_TABLET | Freq: Every day | ORAL | Status: DC

## 2021-05-11 NOTE — H&P (Signed)
History and Physical    Jordan Lambert WNI:[id number] DOB: [birth year] DOA: 05/11/2021  PCP: Monico Blitz, MD (Confirm with patient/family/NH records and if not entered, this has to be entered at [facility] point of entry) Patient coming from: Home  I have personally briefly reviewed patient's old medical records in [place]  Chief Complaint: Chest pain  HPI: Jordan Lambert is a 65 y.o. male with medical history significant of CAD, HTN, chronic systolic CHF LVEF 38% on stress test 2018, OSA not on CPAP, peripheral neuropathy, came with recurrent chest pain.  Patient started to have intermittent chest pains about 5 to 6 weeks ago, went to [facility] on 10/13, troponins were negative and PE study negative and patient was discharged home.  Subsequently patient had several similar episodes of chest pain similar to cardiology who did a CT coronary artery showed mid-LAD >70% stenosis on 04/22/2021.  Patient was scheduled to have a cardiac cath on 05/15/2021.  Is ribboning after dinner, around 7 PM, patient started having another episode of chest pains, squeeze like, radiated to the left arm, between 9/10, denied any palpitations, no sweating or nausea or vomiting.  He checked his blood pressure more than 200 SBP at the time.  Patient took 181 mg aspirin, chest pain subsided.  But this morning, chest pain came back again, so he decided come to ED.  ED Course: Blood pressure significantly elevated, EKG showed chronic ST changes on 2 3 aVF.  Troponin negative x2.  Patient was evaluated by cardiology who started patient on heparin drip and nitroglycerin drip, chest pain improved afterwards.  Review of Systems: As per HPI otherwise 14 point review of systems negative.    Past Medical History:  Diagnosis Date   Arthritis    Diabetes mellitus without complication ([place])    not on meds   Hypertension    has been taken off bp meds    Nerve damage    nerve left hip down to left foot   Sleep  apnea    does not use cpap    Past Surgical History:  Procedure Laterality Date   ANTERIOR CERVICAL DECOMP/DISCECTOMY FUSION N/A 02/20/2017   Procedure: CERVICAL THREE-FOUR ANTERIOR CERVICAL DECOMPRESSION/DISCECTOMY FUSION;  Surgeon: Eustace Moore, MD;  Location: [place];  Service: Neurosurgery;  Laterality: N/A;   APPENDECTOMY     BACK SURGERY     HERNIA REPAIR     neck fusion     nerve stimulor placed     sciatica       reports that he has been smoking cigarettes. He has been smoking an average of 1 pack per day. He has never used smokeless tobacco. He reports current alcohol use. He reports that he does not use drugs.  Allergies  Allergen Reactions   Sulfamethoxazole-Trimethoprim Rash    Sores in mouth    History reviewed. No pertinent family history.   Prior to Admission medications   Medication Sig Start Date End Date Taking? Authorizing Provider  aspirin EC 81 MG tablet Take 1 tablet (81 mg total) by mouth daily. Swallow whole. 04/10/21   Arnoldo Lenis, MD  atorvastatin (LIPITOR) 40 MG tablet Take 40 mg by mouth daily. 02/14/21   [provider]  HYDROmorphone (DILAUDID) 4 MG tablet Take 4 mg by mouth every 6 (six) hours as needed for moderate pain. 03/14/21   [provider]  ibuprofen (ADVIL) 200 MG tablet Take 600 mg by mouth every 8 (eight) hours as needed  for moderate pain.    [provider]  lisinopril (PRINIVIL,ZESTRIL) 10 MG tablet Take 10 mg by mouth daily. 01/20/17   [provider]  traZODone (DESYREL) 100 MG tablet Take 100 mg by mouth at bedtime. 02/14/21   [provider]    Physical Exam: Vitals:   05/11/21 1030 05/11/21 1045 05/11/21 1130 05/11/21 1215  BP: 136/78 (!) 141/74 137/79 (!) 145/80  Pulse: (!) 58 (!) 55 64 (!) 58  Resp: 17 16 20 18   Temp:      TempSrc:      SpO2: 95% 94% 97% 95%  Weight:      Height:        Constitutional: NAD, calm, comfortable Vitals:   05/11/21 1030 05/11/21 1045 05/11/21  1130 05/11/21 1215  BP: 136/78 (!) 141/74 137/79 (!) 145/80  Pulse: (!) 58 (!) 55 64 (!) 58  Resp: 17 16 20 18   Temp:      TempSrc:      SpO2: 95% 94% 97% 95%  Weight:      Height:       Eyes: PERRL, lids and conjunctivae normal ENMT: Mucous membranes are moist. Posterior pharynx clear of any exudate or lesions.Normal dentition.  Neck: normal, supple, no masses, no thyromegaly Respiratory: clear to auscultation bilaterally, no wheezing, no crackles. Normal respiratory effort. No accessory muscle use.  Cardiovascular: Regular rate and rhythm, no murmurs / rubs / gallops. No extremity edema. 2+ pedal pulses. No carotid bruits.  Abdomen: no tenderness, no masses palpated. No hepatosplenomegaly. Bowel sounds positive.  Musculoskeletal: no clubbing / cyanosis. No joint deformity upper and lower extremities. Good ROM, no contractures. Normal muscle tone.  Skin: no rashes, lesions, ulcers. No induration Neurologic: CN 2-12 grossly intact. Sensation intact, DTR normal. Strength 5/5 in all 4.  Psychiatric: Normal judgment and insight. Alert and oriented x 3. Normal mood.    Labs on Admission: I have personally reviewed following labs and imaging studies  CBC: Recent Labs  Lab 05/11/21 0703  WBC 10.2  HGB 14.6  HCT 43.4  MCV 91.8  PLT 239   Basic Metabolic Panel: Recent Labs  Lab 05/11/21 0703  NA 137  K 3.9  CL 105  CO2 24  GLUCOSE 151*  BUN 7*  CREATININE 0.81  CALCIUM 9.4   GFR: Estimated Creatinine Clearance: 88 mL/min (by C-G formula based on SCr of 0.81 mg/dL). Liver Function Tests: No results for input(s): AST, ALT, ALKPHOS, BILITOT, PROT, ALBUMIN in the last 168 hours. No results for input(s): LIPASE, AMYLASE in the last 168 hours. No results for input(s): AMMONIA in the last 168 hours. Coagulation Profile: No results for input(s): INR, PROTIME in the last 168 hours. Cardiac Enzymes: No results for input(s): CKTOTAL, CKMB, CKMBINDEX, TROPONINI in the last 168  hours. BNP (last 3 results) No results for input(s): PROBNP in the last 8760 hours. HbA1C: No results for input(s): HGBA1C in the last 72 hours. CBG: No results for input(s): GLUCAP in the last 168 hours. Lipid Profile: No results for input(s): CHOL, HDL, LDLCALC, TRIG, CHOLHDL, LDLDIRECT in the last 72 hours. Thyroid Function Tests: No results for input(s): TSH, T4TOTAL, FREET4, T3FREE, THYROIDAB in the last 72 hours. Anemia Panel: No results for input(s): VITAMINB12, FOLATE, FERRITIN, TIBC, IRON, RETICCTPCT in the last 72 hours. Urine analysis: No results found for: COLORURINE, APPEARANCEUR, [place], [place], GLUCOSEU, HGBUR, BILIRUBINUR, KETONESUR, PROTEINUR, UROBILINOGEN, NITRITE, LEUKOCYTESUR  Radiological Exams on Admission: DG Chest 2 View  Result Date: 05/11/2021 CLINICAL DATA:  Chest pain EXAM: CHEST - 2 VIEW COMPARISON:  03/21/2021 FINDINGS: The lungs are clear without focal pneumonia, edema, pneumothorax or pleural effusion. The cardiopericardial silhouette is within normal limits for size. The visualized bony structures of the thorax show no acute abnormality. Status post lower thoracic vertebral augmentation. Thoracic spinal stimulator device again noted. IMPRESSION: No active cardiopulmonary disease. Electronically Signed   By: Misty Stanley M.D.   On: 05/11/2021 07:37    EKG: Independently reviewed.  Sinus, chronic ST changes in lead II, III and aVF  Assessment/Plan Principal Problem:   Angina at rest [facility]) Active Problems:   Angina pectoris ([place])  (please populate well all problems here in Problem List. (For example, if patient is on BP meds at home and you resume or decide to hold them, it is a problem that needs to be her. Same for CAD, COPD, HLD and so on)  Unstable angina -Continue ACS meds, heparin drip, aspirin, increase Crestor dose from 20 to 40 mg daily.  Continue lisinopril.  Hold beta-blocker due to borderline bradycardia. -Cardiology will reevaluate patient  and decide emergency cath vs scheduled cath tomorrow. NPO after midnight.  Uncontrolled hypertension -On nitroglycerin drip -Resume lisinopril.  Chronic systolic CHF -Euvolemic, aggressive BP management.  DVT prophylaxis: Heparin gtt Code Status: Full code Family Communication: Wife  Disposition Plan: Expect 2 days of heparin gtt for ACS treatment. Consults called: Cardiology Admission status: PCU   Lequita Halt MD [facility] Pager [phone]  05/11/2021, 12:36 PM

## 2021-05-11 NOTE — Progress Notes (Incomplete)
Patient admitted for chest pain with recent positive coronary CTA with FFR positive for mid LAD stenosis.  He was plan for coronary angiography this Wednesday but had acute onset severe anterior chest pain with radiation to bilateral scapula.

## 2021-05-11 NOTE — ED Notes (Signed)
Pt is currently in xray and will be transported to room when finished

## 2021-05-11 NOTE — Progress Notes (Signed)
ANTICOAGULATION CONSULT NOTE - Follow Up Consult Pharmacy Consult for Heparin drip Indication: chest pain/ACS  Allergies  Allergen Reactions   Sulfamethoxazole-Trimethoprim Rash    Sores in mouth    Patient Measurements: Height: 5\' 8"  (172.7 cm) Weight: 73.5 kg (162 lb) IBW/kg (Calculated) : 68.4 Heparin Dosing Weight: 73.5 kg  Vital Signs: Temp: 98 F (36.7 C) (12/03 0754) Temp Source: Oral (12/03 0754) BP: 133/72 (12/03 0840) Pulse Rate: 57 (12/03 0840)  Labs: Recent Labs    05/11/21 0703  HGB 14.6  HCT 43.4  PLT 235  CREATININE 0.81  TROPONINIHS 6    Estimated Creatinine Clearance: 88 mL/min (by C-G formula based on SCr of 0.81 mg/dL).   Medications:  Infusions:   nitroGLYCERIN      Assessment: 65 yo M presenting with chest pain since last night.  Recently saw cardiology on 11/29 for similar concerns with recommendations for cardiac cath. Initiation of heparin drip for ACS/STEMI. No anticoagulation PTA noted  No documented signs/symptoms of bleed  Goal of Therapy:  Heparin level 0.3-0.7 units/ml Monitor platelets by anticoagulation protocol: Yes   Plan:  Heparin bolus x4000 units followed by heparin drip at 900 units/hr Heparin level at 1500 Daily heparin level and CBC ordered Follow up cardiology recommendations  Thank you for allowing pharmacy to be a part of this patient's care.  Donnald Garre, PharmD Clinical Pharmacist  Please check AMION for all [place] numbers After 10:00 PM, call [place] [phone]

## 2021-05-11 NOTE — ED Triage Notes (Signed)
Pt here POV d/t chest pain since 2000 05/11/21. Pt states its pressure and tight radiating to left arm. Endorses SOB. Pt states he is scheduled to cardiac cath Wed Dec 7. Could not tolerate pain. Pt looks uncomfortable. Hypertensive in triage.

## 2021-05-11 NOTE — Consult Note (Signed)
CardiologyConsult   Patient ID: Jordan Lambert MRN: [id number]; DOB: [birth year]   Admission date: 05/11/2021  PCP:  Monico Blitz, MD   [facility] Providers Cardiologist:  Carlyle Dolly, MD       Chief Complaint:  Chest pain  Patient Profile:   Jordan Lambert is a 65 y.o. male with HTN, tobacco use and known CAD with recent CTA with mid LAD lesion with positive FFR and RCA CTO who is being seen 05/11/2021 for the evaluation of chest tightness at the request of Dr. Matilde Sprang.  History of Present Illness:   Jordan Lambert is a 65 year old male who follows regularly with Dr. Harl Bowie. Was last seen as a telemedicine visit on 05/07/21 where he was continuing to have intermittent chest tightness. Coronary CTA on 04/22/21 with mid-LAD stenosis with FFR 0.83>0.77; RCA with CTO. TTE 2018 with LVEF 50-55%, no WMA. The patient was planned for cath with Dr. Irish Lack on Monday but presented to the ER with persistent substernal chest pain with concern for ACS for which Cardiology was called.  The patient states that he has been having progressive substernal chest tightness over the past several months. Yesterday evening, however, his symptoms worsened and he was unable to get comfortable or rest. This morning, he decided to come to the ER for further management.  In the ER, initial trop 6. ECG without significant STD/STE. Labs with normal electrolytes, kidney function and hemoglobin. CXR without acute findings.  Currently, the patient is continuing to have chest pain despite SL-NTG. He is diaphoretic and uncomfortable appearing on exam. He denies shortness of breath, palpitations, lightheadedness, LE edema, orthopnea or PND.   Past Medical History:  Diagnosis Date   Arthritis    Diabetes mellitus without complication ([place])    not on meds   Hypertension    has been taken off bp meds    Nerve damage    nerve left hip down to left foot   Sleep apnea    does not use cpap    Past Surgical History:   Procedure Laterality Date   ANTERIOR CERVICAL DECOMP/DISCECTOMY FUSION N/A 02/20/2017   Procedure: CERVICAL THREE-FOUR ANTERIOR CERVICAL DECOMPRESSION/DISCECTOMY FUSION;  Surgeon: Eustace Moore, MD;  Location: [place];  Service: Neurosurgery;  Laterality: N/A;   APPENDECTOMY     BACK SURGERY     HERNIA REPAIR     neck fusion     nerve stimulor placed     sciatica       Medications Prior to Admission: Prior to Admission medications   Medication Sig Start Date End Date Taking? Authorizing Provider  aspirin EC 81 MG tablet Take 1 tablet (81 mg total) by mouth daily. Swallow whole. 04/10/21   Arnoldo Lenis, MD  atorvastatin (LIPITOR) 40 MG tablet Take 40 mg by mouth daily. 02/14/21   [provider]  HYDROmorphone (DILAUDID) 4 MG tablet Take 4 mg by mouth every 6 (six) hours as needed for moderate pain. 03/14/21   [provider]  ibuprofen (ADVIL) 200 MG tablet Take 600 mg by mouth every 8 (eight) hours as needed for moderate pain.    [provider]  lisinopril (PRINIVIL,ZESTRIL) 10 MG tablet Take 10 mg by mouth daily. 01/20/17   [provider]  traZODone (DESYREL) 100 MG tablet Take 100 mg by mouth at bedtime. 02/14/21   [provider]     Allergies:    Allergies  Allergen Reactions   Sulfamethoxazole-Trimethoprim Rash    Sores in  mouth    Social History:   Social History   Socioeconomic History   Marital status: Married    Spouse name: Not on file   Number of children: Not on file   Years of education: Not on file   Highest education level: Not on file  Occupational History   Not on file  Tobacco Use   Smoking status: Every Day    Packs/day: 1.00    Types: Cigarettes   Smokeless tobacco: Never  Vaping Use   Vaping Use: Never used  Substance and Sexual Activity   Alcohol use: Yes    Comment: occasionally   Drug use: No   Sexual activity: Not on file  Other Topics Concern   Not on file  Social History Narrative   Not on  file   Social Determinants of Health   Financial Resource Strain: Not on file  Food Insecurity: Not on file  Transportation Needs: Not on file  Physical Activity: Not on file  Stress: Not on file  Social Connections: Not on file  Intimate Partner Violence: Not on file    Family History:   The patient's family history is not on file.    ROS:  Please see the history of present illness.  Review of Systems  Constitutional:  Positive for diaphoresis and malaise/fatigue. Negative for fever.  HENT:  Negative for sore throat.   Respiratory:  Positive for shortness of breath.   Cardiovascular:  Positive for chest pain. Negative for palpitations, orthopnea, claudication, leg swelling and PND.  Gastrointestinal:  Negative for melena and nausea.  Genitourinary:  Negative for flank pain.  Musculoskeletal:  Negative for falls.  Neurological:  Negative for dizziness and loss of consciousness.      Physical Exam/Data:   Vitals:   05/11/21 1030 05/11/21 1045 05/11/21 1130 05/11/21 1215  BP: 136/78 (!) 141/74 137/79 (!) 145/80  Pulse: (!) 58 (!) 55 64 (!) 58  Resp: 17 16 20 18   Temp:      TempSrc:      SpO2: 95% 94% 97% 95%  Weight:      Height:       No intake or output data in the 24 hours ending 05/11/21 1302 Last 3 Weights 05/11/2021 05/07/2021 04/10/2021  Weight (lbs) 162 lb 162 lb 163 lb 6.4 oz  Weight (kg) 73.483 kg 73.483 kg 74.118 kg     Body mass index is 24.63 kg/m.  General:  Uncomfortable appearing HEENT: normal Neck: no JVD Vascular: No carotid bruits; Distal pulses 2+ bilaterally   Cardiac:  normal S1, S2; RRR; no murmur  Lungs:  clear to auscultation bilaterally, no wheezing, rhonchi or rales  Abd: soft, nontender, no hepatomegaly  Ext: no edema Musculoskeletal:  No deformities, BUE and BLE strength normal and equal Skin: warm and dry  Neuro:  CNs 2-12 intact, no focal abnormalities noted Psych:  Normal affect    EKG:  The ECG that was done  was personally  reviewed and demonstrates NSR with no acute ischemic changes  Relevant CV Studies: 02/2017 echo Study Conclusions   - Left ventricle: The cavity size was normal. Wall thickness was    increased in a pattern of mild LVH. Systolic function was normal.    The estimated ejection fraction was in the range of 50% to 55%.    Doppler parameters are consistent with abnormal left ventricular    relaxation (grade 1 diastolic dysfunction).    04/2021 coronary CTA RCA is a large dominant artery  that gives rise to PDA and PLA. There is 50-70% moderate stenosis calcified plaque in the mid RCA.   Left main is a large artery that gives rise to LAD and LCX arteries. There is a minimal distal lesions that leads into ostial LAD and LCX lesions.   LAD is a large vessel that gives rise to one large D1 Branch. There is a 25-49% mild calcified plaque in the ostial LAD. There is a moderate stenosis calcified plaque in the proximal and mid LAD with soft plaque mild obstruction distal to the mLAD moderate stenosis and distal LAD calcifications. There is a moderate stenosis in the D1 and mild non obstructive disease in the diagonal body.   LCX is a non-dominant artery that gives rise to two OM branches. There is a minimal non obstructive mixed plaque proximal lesion and a calcified moderate stenosis at the OM1 take off and into the mid LCX. Luminal irregularities in the OM1.     IMPRESSION: 1. Coronary calcium score of 1313. This was 94th percentile for age, sex, and race matched control.   2. Normal coronary origin with right dominance.   3. CAD-RADS 3. Moderate stenosis. Consider symptom-guided anti-ischemic pharmacotherapy as well as risk factor modification per guideline directed care. Additional analysis with CT FFR will be submitted.   FFR 1. Left Main: No significant functional stenosis.   2. LAD: significant functional stenosis, CT-FFR 0.83-> 0.77 at mLAD lesion. Ostial and Proximal LAD  lesions do not show significant functional stenosis. 3. LCX: No significant functional stenosis, at [facility], CT-FFR 0.89-> 0.85. 4. RCA: Modeled as CTO after mRCA lesion.   IMPRESSION: 1. CT FFR analysis shows evidence of significant functional stenosis.  Laboratory Data:  High Sensitivity Troponin:   Recent Labs  Lab 05/11/21 0703 05/11/21 0910  TROPONINIHS 6 6      Chemistry Recent Labs  Lab 05/11/21 0703  NA 137  K 3.9  CL 105  CO2 24  GLUCOSE 151*  BUN 7*  CREATININE 0.81  CALCIUM 9.4  GFRNONAA >60  ANIONGAP 8    No results for input(s): PROT, ALBUMIN, AST, ALT, ALKPHOS, BILITOT in the last 168 hours. Lipids No results for input(s): CHOL, TRIG, HDL, LABVLDL, LDLCALC, CHOLHDL in the last 168 hours. Hematology Recent Labs  Lab 05/11/21 0703  WBC 10.2  RBC 4.73  HGB 14.6  HCT 43.4  MCV 91.8  MCH 30.9  MCHC 33.6  RDW 12.2  PLT 235   Thyroid No results for input(s): TSH, FREET4 in the last 168 hours. BNPNo results for input(s): BNP, PROBNP in the last 168 hours.  DDimer No results for input(s): DDIMER in the last 168 hours.   Radiology/Studies:  DG Chest 2 View  Result Date: 05/11/2021 CLINICAL DATA:  Chest pain EXAM: CHEST - 2 VIEW COMPARISON:  03/21/2021 FINDINGS: The lungs are clear without focal pneumonia, edema, pneumothorax or pleural effusion. The cardiopericardial silhouette is within normal limits for size. The visualized bony structures of the thorax show no acute abnormality. Status post lower thoracic vertebral augmentation. Thoracic spinal stimulator device again noted. IMPRESSION: No active cardiopulmonary disease. Electronically Signed   By: Misty Stanley M.D.   On: 05/11/2021 07:37     Assessment and Plan:   #Chest Pain: #Known LAD Disease Patient with substernal chest tightness that has been ongoing since yesterday evening in the setting of known significant mid-LAD disease with positive FFR noted on CTA. Trop on admission negative x2. ECG  without ischemic changes. While patient does  have known disease, would expect his troponins to be elevated if active chest pain at rest due to ischemia. He definitely needs cath this admission, however, his pain is out-of-proportion to his CV work-up thus far. Will initiate nitro gtt, heparin gtt and monitor for improvement of symptoms. No need for urgent cath at this time unless symptoms refractory/ECG changes/trop rising.  -NPO at MN pending symptoms; if stable, plan for cath on Monday  -Continue nitro gtt -Continue heparin gtt -Continue ASA 81mg  daily -Continue lisinopril 10mg  daily -Continue crestor 40mg  daily -No BB due to bradycardia -Follow-up TTE -Tobacco cessation -Monitor on telemetry  #HTN: -Nitro gtt as above -Continue lisinopril 40mg  daily  #HLD: -Continue crestor 40mg  daily as above   Risk Assessment/Risk Scores:    TIMI Risk Score for Unstable Angina or Non-ST Elevation MI:   The patient's TIMI risk score is 5, which indicates a 26% risk of all cause mortality, new or recurrent myocardial infarction or need for urgent revascularization in the next 14 days.{   New York Heart Association (NYHA) Functional Class NYHA Class II      For questions or updates, please contact [facility] Please consult [email or web address] for contact info under     Signed, Freada Bergeron, MD  05/11/2021 1:02 PM

## 2021-05-11 NOTE — Plan of Care (Signed)
  Problem: Education: Goal: Knowledge of General Education information will improve Description: Including pain rating scale, medication(s)/side effects and non-pharmacologic comfort measures Outcome: Progressing   Problem: Health Behavior/Discharge Planning: Goal: Ability to manage health-related needs will improve Outcome: Progressing   Problem: Clinical Measurements: Goal: Ability to maintain clinical measurements within normal limits will improve Outcome: Progressing   Problem: Clinical Measurements: Goal: Cardiovascular complication will be avoided Outcome: Progressing   Problem: Activity: Goal: Risk for activity intolerance will decrease Outcome: Progressing   Problem: Pain Managment: Goal: General experience of comfort will improve Outcome: Progressing   Problem: Safety: Goal: Ability to remain free from injury will improve Outcome: Progressing   Problem: Cardiovascular: Goal: Ability to achieve and maintain adequate cardiovascular perfusion will improve Outcome: Progressing

## 2021-05-11 NOTE — ED Notes (Signed)
Pt reports significant relief of chest pain since start of nitro gtt
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
## 2021-05-11 NOTE — Progress Notes (Signed)
ANTICOAGULATION CONSULT NOTE - Follow Up Consult Pharmacy Consult for Heparin drip Indication: chest pain/ACS  Allergies  Allergen Reactions   Sulfamethoxazole-Trimethoprim Rash    Sores in mouth    Patient Measurements: Height: 5\' 8"  (172.7 cm) Weight: 73.5 kg (162 lb) IBW/kg (Calculated) : 68.4 Heparin Dosing Weight: 73.5 kg  Vital Signs: Temp: 98 F (36.7 C) (12/03 0754) Temp Source: Oral (12/03 0754) BP: 164/79 (12/03 1359) Pulse Rate: 57 (12/03 1300)  Labs: Recent Labs    05/11/21 0703 05/11/21 0910 05/11/21 1528  HGB 14.6  --   --   HCT 43.4  --   --   PLT 235  --   --   HEPARINUNFRC  --   --  0.27*  CREATININE 0.81  --   --   TROPONINIHS 6 6  --      Estimated Creatinine Clearance: 88 mL/min (by C-G formula based on SCr of 0.81 mg/dL).   Medications:  Infusions:   heparin 900 Units/hr (05/11/21 0909)   nitroGLYCERIN 5 mcg/min (05/11/21 0915)    Assessment: 65 yo M presenting with chest pain since last night.  Recently saw cardiology on 11/29 for similar concerns with recommendations for cardiac cath. Initiation of heparin drip for ACS/STEMI. No anticoagulation PTA noted  No documented signs/symptoms of bleed. HL back at 0.27 which is sub-therapeutic.  Goal of Therapy:  Heparin level 0.3-0.7 units/ml Monitor platelets by anticoagulation protocol: Yes   Plan:  Heparin bolus x 1000 units and increase heparin drip at 1050 units/hr Heparin level at 2300 Daily heparin level and CBC ordered Follow up cardiology recommendations  Thank you for allowing pharmacy to be a part of this patient's care.  Lorelei Pont, PharmD, BCPS 05/11/2021 4:22 PM ED Clinical Pharmacist -  [phone]

## 2021-05-12 ENCOUNTER — Inpatient Hospital Stay (HOSPITAL_COMMUNITY): Payer: Medicare Other

## 2021-05-12 ENCOUNTER — Other Ambulatory Visit (HOSPITAL_COMMUNITY): Payer: Medicare Other

## 2021-05-12 DIAGNOSIS — R079 Chest pain, unspecified: Secondary | ICD-10-CM | POA: Diagnosis not present

## 2021-05-12 DIAGNOSIS — R519 Headache, unspecified: Secondary | ICD-10-CM

## 2021-05-12 DIAGNOSIS — I10 Essential (primary) hypertension: Secondary | ICD-10-CM

## 2021-05-12 LAB — BASIC METABOLIC PANEL
Anion gap: 10 (ref 5–15)
BUN: 9 mg/dL (ref 8–23)
CO2: 23 mmol/L (ref 22–32)
Calcium: 9.2 mg/dL (ref 8.9–10.3)
Chloride: 104 mmol/L (ref 98–111)
Creatinine, Ser: 0.81 mg/dL (ref 0.61–1.24)
GFR, Estimated: >60 mL/min (ref >60)
Glucose, Bld: 109 mg/dL — ABNORMAL HIGH (ref 70–99)
Potassium: 3.4 mmol/L — ABNORMAL LOW (ref 3.5–5.1)
Sodium: 137 mmol/L (ref 135–145)

## 2021-05-12 LAB — CBC
HCT: 42.7 % (ref 39.0–52.0)
Hemoglobin: 14.7 g/dL (ref 13.0–17.0)
MCH: 31.2 pg (ref 26.0–34.0)
MCHC: 34.4 g/dL (ref 30.0–36.0)
MCV: 90.7 fL (ref 80.0–100.0)
Platelets: 204 10*3/uL (ref 150–400)
RBC: 4.71 MIL/uL (ref 4.22–5.81)
RDW: 12.1 % (ref 11.5–15.5)
WBC: 9.8 10*3/uL (ref 4.0–10.5)
nRBC: 0 % (ref 0.0–0.2)

## 2021-05-12 LAB — ECHOCARDIOGRAM COMPLETE
Area-P 1/2: 3.5 cm2
Calc EF: 44.1 %
Height: 68 in
S' Lateral: 3.4 cm
Single Plane A2C EF: 49.7 %
Single Plane A4C EF: 36.3 %
Weight: 2523.2 oz

## 2021-05-12 LAB — MAGNESIUM: Magnesium: 2 mg/dL (ref 1.7–2.4)

## 2021-05-12 LAB — PHOSPHORUS: Phosphorus: 4 mg/dL (ref 2.5–4.6)

## 2021-05-12 LAB — HEPARIN LEVEL (UNFRACTIONATED)
Heparin Unfractionated: 0.38 IU/mL (ref 0.30–0.70)
Heparin Unfractionated: 0.3 [IU]/mL (ref 0.30–0.70)

## 2021-05-12 MED ORDER — POTASSIUM CHLORIDE CRYS ER 20 MEQ PO TBCR
40.0000 meq | EXTENDED_RELEASE_TABLET | Freq: Two times a day (BID) | ORAL | Status: AC
  Administered 2021-05-12 (×2): 40 meq via ORAL
  Filled 2021-05-12 (×2): qty 2

## 2021-05-12 MED ORDER — AMLODIPINE BESYLATE 5 MG PO TABS
5.0000 mg | ORAL_TABLET | Freq: Every day | ORAL | Status: DC
  Administered 2021-05-12 – 2021-05-14 (×3): 5 mg via ORAL
  Filled 2021-05-12 (×3): qty 1

## 2021-05-12 NOTE — Progress Notes (Signed)
ANTICOAGULATION CONSULT NOTE - Follow Up Consult Pharmacy Consult for Heparin drip Indication: chest pain/ACS  Allergies  Allergen Reactions   Sulfamethoxazole-Trimethoprim Rash    Sores in mouth    Patient Measurements: Height: 5\' 8"  (172.7 cm) Weight: 71.5 kg (157 lb 11.2 oz) IBW/kg (Calculated) : 68.4 Heparin Dosing Weight: 73.5 kg  Vital Signs: Temp: 98.2 F (36.8 C) (12/04 0444) Temp Source: Axillary (12/04 0444) BP: 158/79 (12/04 0444) Pulse Rate: 57 (12/04 0444)  Labs: Recent Labs    05/11/21 0703 05/11/21 0910 05/11/21 1528 05/11/21 1818 05/12/21 0322  HGB 14.6  --   --   --  14.7  HCT 43.4  --   --   --  42.7  PLT 235  --   --   --  204  HEPARINUNFRC  --   --  0.27* 0.26* 0.38  CREATININE 0.81  --   --   --  0.81  TROPONINIHS 6 6  --   --   --      Estimated Creatinine Clearance: 88 mL/min (by C-G formula based on SCr of 0.81 mg/dL).   Medications:  Infusions:   heparin 1,050 Units/hr (05/12/21 0444)   nitroGLYCERIN 5 mcg/min (05/11/21 0915)    Assessment: 65 yo M presenting with chest pain since last night.  Recently saw cardiology on 11/29 for similar concerns with recommendations for cardiac cath. Initiation of heparin drip for ACS/STEMI. No anticoagulation PTA noted  No documented signs/symptoms of bleed. HL back at 0.27 which is sub-therapeutic.  12/4 AM update:   Heparin level therapeutic    Goal of Therapy:  Heparin level 0.3-0.7 units/ml Monitor platelets by anticoagulation protocol: Yes   Plan:  Cont heparin 1050 units/hr 1200 heparin level  Narda Bonds, PharmD, BCPS Clinical Pharmacist Phone: [phone]

## 2021-05-12 NOTE — Progress Notes (Signed)
ANTICOAGULATION CONSULT NOTE - Follow Up Consult Pharmacy Consult for Heparin drip Indication: chest pain/ACS  Allergies  Allergen Reactions   Sulfamethoxazole-Trimethoprim Rash    Sores in mouth    Patient Measurements: Height: 5\' 8"  (172.7 cm) Weight: 71.5 kg (157 lb 11.2 oz) IBW/kg (Calculated) : 68.4 Heparin Dosing Weight: 73.5 kg  Vital Signs: Temp: 98.2 F (36.8 C) (12/04 0444) Temp Source: Axillary (12/04 0444) BP: 171/94 (12/04 0859) Pulse Rate: 58 (12/04 0700)  Labs: Recent Labs    05/11/21 0703 05/11/21 0910 05/11/21 1528 05/11/21 1818 05/12/21 0322 05/12/21 1135  HGB 14.6  --   --   --  14.7  --   HCT 43.4  --   --   --  42.7  --   PLT 235  --   --   --  204  --   HEPARINUNFRC  --   --    < > 0.26* 0.38 0.30  CREATININE 0.81  --   --   --  0.81  --   TROPONINIHS 6 6  --   --   --   --    < > = values in this interval not displayed.     Estimated Creatinine Clearance: 88 mL/min (by C-G formula based on SCr of 0.81 mg/dL).   Medications:  Infusions:   heparin 1,050 Units/hr (05/12/21 0600)   nitroGLYCERIN 15 mcg/min (05/12/21 0716)    Assessment: 65 yo M presenting with chest pain since last night.  Recently saw cardiology on 11/29 for similar concerns with recommendations for cardiac cath. Initiation of heparin drip for ACS/STEMI. No anticoagulation PTA noted  Heparin level came back therapeutic again but on the lower side. Will increase rate slightly and f/u in AM. Plan for cath.   Cbc stable   Goal of Therapy:  Heparin level 0.3-0.7 units/ml Monitor platelets by anticoagulation protocol: Yes   Plan:  Increase heparin 1100 units/hr Daily heparin level  Onnie Boer, PharmD, [place], AAHIVP, CPP Infectious Disease Pharmacist 05/12/2021 12:14 PM

## 2021-05-12 NOTE — Progress Notes (Signed)
Progress Note  Patient Name: Jordan Lambert Date of Encounter: 05/12/2021  [facility] Cardiologist: Carlyle Dolly, MD   Subjective   Chest pain resolved. Complaining of joint pain this AM.  CTA chest negative for dissection.Trop remained negative. BP elevated this AM  Inpatient Medications    Scheduled Meds:  aspirin EC  81 mg Oral Daily   lisinopril  10 mg Oral Daily   rosuvastatin  40 mg Oral Daily   traZODone  100 mg Oral QHS   Continuous Infusions:  heparin 1,050 Units/hr (05/12/21 0444)   nitroGLYCERIN 5 mcg/min (05/11/21 0915)   PRN Meds: acetaminophen, acetaminophen, HYDROmorphone, nitroGLYCERIN, ondansetron (ZOFRAN) IV   Vital Signs    Vitals:   05/11/21 2220 05/11/21 2320 05/12/21 0239 05/12/21 0444  BP: (!) 158/64 121/72 (!) 155/80 (!) 158/79  Pulse:  (!) 59 63 (!) 57  Resp:  16 15 12   Temp:  98.1 F (36.7 C)  98.2 F (36.8 C)  TempSrc:  Oral  Axillary  SpO2:  93% 91% 94%  Weight:      Height:        Intake/Output Summary (Last 24 hours) at 05/12/2021 0612 Last data filed at 05/12/2021 0000 Gross per 24 hour  Intake 514.93 ml  Output 700 ml  Net -185.07 ml   Last 3 Weights 05/11/2021 05/11/2021 05/07/2021  Weight (lbs) 157 lb 11.2 oz 162 lb 162 lb  Weight (kg) 71.532 kg 73.483 kg 73.483 kg      Telemetry    NSR - Personally Reviewed  ECG    No new tracing - Personally Reviewed  Physical Exam   GEN: No acute distress.   Neck: No JVD Cardiac: RRR, no murmurs, rubs, or gallops.  Respiratory: Clear to auscultation bilaterally. GI: Soft, nontender, non-distended  MS: No edema; No deformity. Neuro:  Nonfocal  Psych: Normal affect   Labs    High Sensitivity Troponin:   Recent Labs  Lab 05/11/21 0703 05/11/21 0910  TROPONINIHS 6 6     Chemistry Recent Labs  Lab 05/11/21 0703 05/12/21 0322  NA 137 137  K 3.9 3.4*  CL 105 104  CO2 24 23  GLUCOSE 151* 109*  BUN 7* 9  CREATININE 0.81 0.81  CALCIUM 9.4 9.2  GFRNONAA  >60 >60  ANIONGAP 8 10    Lipids No results for input(s): CHOL, TRIG, HDL, LABVLDL, LDLCALC, CHOLHDL in the last 168 hours.  Hematology Recent Labs  Lab 05/11/21 0703 05/12/21 0322  WBC 10.2 9.8  RBC 4.73 4.71  HGB 14.6 14.7  HCT 43.4 42.7  MCV 91.8 90.7  MCH 30.9 31.2  MCHC 33.6 34.4  RDW 12.2 12.1  PLT 235 204   Thyroid No results for input(s): TSH, FREET4 in the last 168 hours.  BNPNo results for input(s): BNP, PROBNP in the last 168 hours.  DDimer No results for input(s): DDIMER in the last 168 hours.   Radiology    DG Chest 2 View  Result Date: 05/11/2021 CLINICAL DATA:  Chest pain EXAM: CHEST - 2 VIEW COMPARISON:  03/21/2021 FINDINGS: The lungs are clear without focal pneumonia, edema, pneumothorax or pleural effusion. The cardiopericardial silhouette is within normal limits for size. The visualized bony structures of the thorax show no acute abnormality. Status post lower thoracic vertebral augmentation. Thoracic spinal stimulator device again noted. IMPRESSION: No active cardiopulmonary disease. Electronically Signed   By: Misty Stanley M.D.   On: 05/11/2021 07:37   CT ANGIO CHEST AORTA W/CM & OR  WO/CM  Result Date: 05/11/2021 CLINICAL DATA:  Chest and back pain, initial encounter EXAM: CT ANGIOGRAPHY CHEST WITH CONTRAST TECHNIQUE: Multidetector CT imaging of the chest was performed using the standard protocol during bolus administration of intravenous contrast. Multiplanar CT image reconstructions and MIPs were obtained to evaluate the vascular anatomy. CONTRAST:  189mL OMNIPAQUE IOHEXOL 350 MG/ML SOLN COMPARISON:  Plain film from earlier in the same day. FINDINGS: Cardiovascular: Thoracic aorta demonstrates atherosclerotic calcification without aneurysmal dilatation or dissection. No cardiac enlargement is seen. Pulmonary artery is well visualized without evidence of pulmonary embolism. Scattered coronary calcifications are noted. Mediastinum/Nodes: Thoracic inlet is within  normal limits. No sizable hilar or mediastinal adenopathy is noted. The esophagus as visualized is within normal limits. Lungs/Pleura: Lungs are well aerated bilaterally. Diffuse emphysematous changes are seen. Mild dependent atelectatic changes are noted. No focal infiltrate or effusion is noted. No parenchymal nodules are seen. Upper Abdomen: Right renal cyst is noted. No other focal abnormality in the upper abdomen is seen. Musculoskeletal: Degenerative changes of the thoracic spine are noted. No rib abnormality is noted. Changes of prior vertebral augmentation and spinal stimulator placement are noted. Review of the MIP images confirms the above findings. IMPRESSION: No evidence of aortic dissection. No pulmonary embolism is seen. Aortic Atherosclerosis (ICD10-I70.0) and Emphysema (ICD10-J43.9). Electronically Signed   By: Inez Catalina M.D.   On: 05/11/2021 21:41    Cardiac Studies  CTA chest 05/11/21: FINDINGS: Cardiovascular: Thoracic aorta demonstrates atherosclerotic calcification without aneurysmal dilatation or dissection. No cardiac enlargement is seen. Pulmonary artery is well visualized without evidence of pulmonary embolism. Scattered coronary calcifications are noted.   Mediastinum/Nodes: Thoracic inlet is within normal limits. No sizable hilar or mediastinal adenopathy is noted. The esophagus as visualized is within normal limits.   Lungs/Pleura: Lungs are well aerated bilaterally. Diffuse emphysematous changes are seen. Mild dependent atelectatic changes are noted. No focal infiltrate or effusion is noted. No parenchymal nodules are seen.   Upper Abdomen: Right renal cyst is noted. No other focal abnormality in the upper abdomen is seen.   Musculoskeletal: Degenerative changes of the thoracic spine are noted. No rib abnormality is noted. Changes of prior vertebral augmentation and spinal stimulator placement are noted.   Review of the MIP images confirms the above  findings.   IMPRESSION: No evidence of aortic dissection.   No pulmonary embolism is seen.  02/2017 echo Study Conclusions   - Left ventricle: The cavity size was normal. Wall thickness was    increased in a pattern of mild LVH. Systolic function was normal.    The estimated ejection fraction was in the range of 50% to 55%.    Doppler parameters are consistent with abnormal left ventricular    relaxation (grade 1 diastolic dysfunction).    04/2021 coronary CTA RCA is a large dominant artery that gives rise to PDA and PLA. There is 50-70% moderate stenosis calcified plaque in the mid RCA.   Left main is a large artery that gives rise to LAD and LCX arteries. There is a minimal distal lesions that leads into ostial LAD and LCX lesions.   LAD is a large vessel that gives rise to one large D1 Branch. There is a 25-49% mild calcified plaque in the ostial LAD. There is a moderate stenosis calcified plaque in the proximal and mid LAD with soft plaque mild obstruction distal to the mLAD moderate stenosis and distal LAD calcifications. There is a moderate stenosis in the D1 and mild non obstructive disease  in the diagonal body.   LCX is a non-dominant artery that gives rise to two OM branches. There is a minimal non obstructive mixed plaque proximal lesion and a calcified moderate stenosis at the OM1 take off and into the mid LCX. Luminal irregularities in the OM1.     IMPRESSION: 1. Coronary calcium score of 1313. This was 94th percentile for age, sex, and race matched control.   2. Normal coronary origin with right dominance.   3. CAD-RADS 3. Moderate stenosis. Consider symptom-guided anti-ischemic pharmacotherapy as well as risk factor modification per guideline directed care. Additional analysis with CT FFR will be submitted.   FFR 1. Left Main: No significant functional stenosis.   2. LAD: significant functional stenosis, CT-FFR 0.83-> 0.77 at mLAD lesion. Ostial and  Proximal LAD lesions do not show significant functional stenosis. 3. LCX: No significant functional stenosis, at [facility], CT-FFR 0.89-> 0.85. 4. RCA: Modeled as CTO after mRCA lesion.   IMPRESSION: 1. CT FFR analysis shows evidence of significant functional stenosis.  Patient Profile     65 y.o. male with HTN, tobacco use and known CAD where CTA demonstrated mid LAD lesion with positive FFR and RCA CTO who presented with chest tightness concerning for UA.  Assessment & Plan    #Chest Pain: #Known LAD Disease Patient with substernal chest tightness that has been ongoing since yesterday evening in the setting of known significant mid-LAD disease with positive FFR noted on CTA. Trop on admission negative x2. ECG without ischemic changes. While patient does have known disease, would expect his troponins to be elevated if active chest pain at rest due to ischemia. He definitely needs cath this admission, however, his pain is out-of-proportion to his CV work-up thus far. Will initiate nitro gtt, heparin gtt and monitor for improvement of symptoms. No need for urgent cath at this time unless symptoms refractory/ECG changes/trop rising.  -NPO at MN for cath in the AM -Continue nitro gtt -Continue heparin gtt -Continue ASA 81mg  daily -Continue lisinopril 10mg  daily -Continue crestor 40mg  daily -No BB due to bradycardia -Follow-up TTE -Tobacco cessation -Monitor on telemetry   #HTN: -Nitro gtt as above -Continue lisinopril 10mg  daily -Start amlodipine 10mg  daily   #HLD: -Continue crestor 40mg  daily as above  INFORMED CONSENT: I have reviewed the risks, indications, and alternatives to cardiac catheterization, possible angioplasty, and stenting with the patient. Risks include but are not limited to bleeding, infection, vascular injury, stroke, myocardial infection, arrhythmia, kidney injury, radiation-related injury in the case of prolonged fluoroscopy use, emergency cardiac surgery, and  death. The patient understands the risks of serious complication is 1-2 in 6808 with diagnostic cardiac cath and 1-2% or less with angioplasty/stenting.      For questions or updates, please contact [place] Please consult [email or web address] for contact info under        Signed, Freada Bergeron, MD  05/12/2021, 6:12 AM

## 2021-05-12 NOTE — Plan of Care (Signed)
  Problem: Education: Goal: Knowledge of General Education information will improve Description: Including pain rating scale, medication(s)/side effects and non-pharmacologic comfort measures Outcome: Progressing   Problem: Health Behavior/Discharge Planning: Goal: Ability to manage health-related needs will improve Outcome: Progressing   Problem: Clinical Measurements: Goal: Cardiovascular complication will be avoided Outcome: Progressing   Problem: Pain Managment: Goal: General experience of comfort will improve Outcome: Progressing   Problem: Safety: Goal: Ability to remain free from injury will improve Outcome: Progressing   

## 2021-05-12 NOTE — Progress Notes (Signed)
PROGRESS NOTE    Jordan Lambert  PVX:[id number] DOB: 1956-05-29 DOA: 05/11/2021 PCP: Monico Blitz, MD   Brief Narrative:  The patient is a 65 year old Caucasian male with a past medical history significant for but not limited to CAD, hypertension, chronic systolic CHF with an LVEF of 46% on stress testing in 2018, OSA not on CPAP, peripheral neuropathy as well as other comorbidities who came in with recurrent chest pain.  Patient started to have intermittent chest pain about 5 to 6 weeks ago when he went to [facility] on 03/21/2021 and troponins were negative and PE study was negative and he was discharged home.  Subsequently patient had several episodes of chest pain and cardiology did a coronary CTA which showed mid LAD greater than 70% stenosis on 04/22/2021.  He is scheduled to have a cardiac cath on 05/15/2021.  The day before admission in the evening after dinner around 7 PM the patient started having another episode of chest pain felt like a squeezing that radiated to his left arm and he described it as a 9 out of 10 in severity but denied any palpitations, sweating nausea or vomiting.  Checked his blood pressure is noted to be current 482 systolic.  He took 181 mg of aspirin and chest pain subsided.  On the morning of admission the chest pain came back so he decided come to the ED for further evaluation.  He is noted to have significantly elevated blood pressure and EKG showed chronic ST changes in leads II, III and aVF.  Troponin was negative x2.  Cardiology evaluated patient was placed on a heparin drip and nitroglycerin drip.  Currently he is being admitted and treated for the following but not limited to:  Assessment & Plan:   Principal Problem:   Angina at rest [facility]) Active Problems:   Angina pectoris ([place])  Chest pain rule out ACS with concern for unstable angina at the setting of known LAD disease -Presented with substernal chest tightness that has been going on since the day before  admission his evening in the setting of significant mid LAD disease with a positive FFR noted on CTA of the chest -Cycle troponins were negative x2 -EKG did not show any ischemic changes -CTA Chest Aorta showed "No evidence of aortic dissection. No pulmonary embolism is seen. Aortic Atherosclerosis and Emphysema."  -Cardiology consulted and recommending cardiac cath this admission -Given that he continued have significant pain they started and initiated a nitroglycerin drip as well as a heparin drip and recommending continue to monitor for symptoms -They do not feel that the cath is urgent at this time unless symptoms are refractory to EKG changes or if troponins rise -Cardiology is planning on cath on Monday if the patient is stable and recommending n.p.o. at midnight -We will continue his ACS meds including a heparin drip, aspirin 81 mg p.o. daily, rosuvastatin 40 mg nightly, lisinopril 10 mg p.o. daily -patient is getting Transthoracic ECHO today and still pending to be done  -Also has NTG 0.4 mg slL q14minprn Chest Pain  -His beta-blocker has been held due to his bradycardia; Last HR was documented to be 45 -Cardiology is obtaining a transthoracic echocardiogram as well -Further care per cardiology -Continue to monitor on telemetry  Hypokalemia -Mild. Potassium was 3.4 and will replete with p.o. potassium chloride 40 mEq twice daily x2 doses -We will check magnesium level -Continue to monitor and replete as necessary -Repeat CMP in a.m.  Headache -From Nitro gtt -Try Acetaminophen  1000 mg po q6hprn Headache  Leg Pain with Chronic Nerve Damage/Chronic Neck Pain  -Has Neuropathy and Chronic Leg Pain from falling out of a tree and getting Cervical Fusion and 3-4 Anterior cervical decompression/discectomy fysuib -C/w Hydromorphone 4 mg po q6hprn Moderate Pain -If Necessary will add meds for breakthrough pain  HLD -C/w Rosuvastatin 40 mg po Daily    Uncontrolled Hypertension -On  nitroglycerin drip -Resumed Lisinopril 10 mg po Daily and has now been started on Amlodipine 5 mg po daily by Cardiology -Continue to monitor blood pressures per protocol -Last blood pressure reading was   Chronic Systolic CHF -Euvolemic, aggressive BP management. -Strict I's and O's and Daily Weights; Patient is -777.4 Liters since Admission  Tobacco Abuse -Smoking Cessation Counseling given   OSA -Dose not use CPAP  DVT prophylaxis: Anticoagulated with Heparin gtt Code Status: FULL CODE Family Communication: No family present at bedside  Disposition Plan: Pending further clinical improvement and clearance by Cardiology   Status is: Inpatient  Remains inpatient appropriate because: Plan is for Cardiac Cath  Consultants:  Cardiology   Procedures: ECHOCARDIOGRAM ordered and pending to be done  Antimicrobials:  Anti-infectives (From admission, onward)    None        Subjective: Seen and examined at bedside and states that his chest pain has now resolved but was complaining of a headache.  Would patient on the left.  No nausea or vomiting.  Understand that he will be getting a cath tomorrow and echocardiogram today.  No other concerns or complaints at this time.  Objective: Vitals:   05/12/21 0444 05/12/21 0630 05/12/21 0647 05/12/21 0700  BP: (!) 158/79 (!) 181/81 (!) 169/95 (!) 166/80  Pulse: (!) 57  76 (!) 58  Resp: 12  18 10   Temp: 98.2 F (36.8 C)     TempSrc: Axillary     SpO2: 94%   98%  Weight:      Height:        Intake/Output Summary (Last 24 hours) at 05/12/2021 0848 Last data filed at 05/12/2021 8588 Gross per 24 hour  Intake 647.65 ml  Output 1100 ml  Net -452.35 ml   Filed Weights   05/11/21 0643 05/11/21 1649  Weight: 73.5 kg 71.5 kg   Examination: Physical Exam:  Constitutional: WN/WD Caucasian male in NAD and appears calm Eyes: Lids and conjunctivae normal, sclerae anicteric  ENMT: External Ears, Nose appear normal. Grossly normal  hearing. Mucous membranes are moist. .  Neck: Appears normal, supple, no cervical masses, normal ROM, no appreciable thyromegaly; no JVD Respiratory: Diminished to auscultation bilaterally, no wheezing, rales, rhonchi or crackles. Normal respiratory effort and patient is not tachypenic. No accessory muscle use. Unlabored breathing  Cardiovascular: RRR, no murmurs / rubs / gallops. S1 and S2 auscultated. Abdomen: Soft, non-tender, non-distended. Bowel sounds positive.  GU: Deferred. Musculoskeletal: No clubbing / cyanosis of digits/nails. No joint deformity upper and lower extremities.   Skin: No rashes, lesions, ulcers on a limited skin evaluation. No induration; Warm and dry.  Neurologic: CN 2-12 grossly intact with no focal deficits.  Romberg sign and cerebellar reflexes not assessed.  Psychiatric: Normal judgment and insight. Alert and oriented x 3. Normal mood and appropriate affect.   Data Reviewed: I have personally reviewed following labs and imaging studies  CBC: Recent Labs  Lab 05/11/21 0703 05/12/21 0322  WBC 10.2 9.8  HGB 14.6 14.7  HCT 43.4 42.7  MCV 91.8 90.7  PLT 235 502   Basic Metabolic Panel:  Recent Labs  Lab 05/11/21 0703 05/12/21 0322  NA 137 137  K 3.9 3.4*  CL 105 104  CO2 24 23  GLUCOSE 151* 109*  BUN 7* 9  CREATININE 0.81 0.81  CALCIUM 9.4 9.2   GFR: Estimated Creatinine Clearance: 88 mL/min (by C-G formula based on SCr of 0.81 mg/dL). Liver Function Tests: No results for input(s): AST, ALT, ALKPHOS, BILITOT, PROT, ALBUMIN in the last 168 hours. No results for input(s): LIPASE, AMYLASE in the last 168 hours. No results for input(s): AMMONIA in the last 168 hours. Coagulation Profile: No results for input(s): INR, PROTIME in the last 168 hours. Cardiac Enzymes: No results for input(s): CKTOTAL, CKMB, CKMBINDEX, TROPONINI in the last 168 hours. BNP (last 3 results) No results for input(s): PROBNP in the last 8760 hours. HbA1C: No results for  input(s): HGBA1C in the last 72 hours. CBG: Recent Labs  Lab 05/11/21 1706  GLUCAP 98   Lipid Profile: No results for input(s): CHOL, HDL, LDLCALC, TRIG, CHOLHDL, LDLDIRECT in the last 72 hours. Thyroid Function Tests: No results for input(s): TSH, T4TOTAL, FREET4, T3FREE, THYROIDAB in the last 72 hours. Anemia Panel: No results for input(s): VITAMINB12, FOLATE, FERRITIN, TIBC, IRON, RETICCTPCT in the last 72 hours. Sepsis Labs: No results for input(s): PROCALCITON, LATICACIDVEN in the last 168 hours.  Recent Results (from the past 240 hour(s))  Resp Panel by RT-PCR (Flu A&B, Covid) Nasopharyngeal Swab     Status: None   Collection Time: 05/11/21  9:01 AM   Specimen: Nasopharyngeal Swab; Nasopharyngeal(NP) swabs in vial transport medium  Result Value Ref Range Status   SARS Coronavirus 2 by RT PCR NEGATIVE NEGATIVE Final    Comment: (NOTE) SARS-CoV-2 target nucleic acids are NOT DETECTED.  The SARS-CoV-2 RNA is generally detectable in upper respiratory specimens during the acute phase of infection. The lowest concentration of SARS-CoV-2 viral copies this assay can detect is 138 copies/mL. A negative result does not preclude SARS-Cov-2 infection and should not be used as the sole basis for treatment or other patient management decisions. A negative result may occur with  improper specimen collection/handling, submission of specimen other than nasopharyngeal swab, presence of viral mutation(s) within the areas targeted by this assay, and inadequate number of viral copies(<138 copies/mL). A negative result must be combined with clinical observations, patient history, and epidemiological information. The expected result is Negative.  Fact Sheet for Patients:  EntrepreneurPulse.com.au  Fact Sheet for Healthcare Providers:  IncredibleEmployment.be  This test is no t yet approved or cleared by the [place] FDA and  has been authorized for  detection and/or diagnosis of SARS-CoV-2 by FDA under an Emergency Use Authorization (EUA). This EUA will remain  in effect (meaning this test can be used) for the duration of the COVID-19 declaration under Section 564(b)(1) of the Act, 21 U.S.C.section 360bbb-3(b)(1), unless the authorization is terminated  or revoked sooner.       Influenza A by PCR NEGATIVE NEGATIVE Final   Influenza B by PCR NEGATIVE NEGATIVE Final    Comment: (NOTE) The Xpert Xpress SARS-CoV-2/FLU/RSV plus assay is intended as an aid in the diagnosis of influenza from Nasopharyngeal swab specimens and should not be used as a sole basis for treatment. Nasal washings and aspirates are unacceptable for Xpert Xpress SARS-CoV-2/FLU/RSV testing.  Fact Sheet for Patients: EntrepreneurPulse.com.au  Fact Sheet for Healthcare Providers: IncredibleEmployment.be  This test is not yet approved or cleared by the [place] FDA and has been authorized for detection and/or diagnosis of  SARS-CoV-2 by FDA under an Emergency Use Authorization (EUA). This EUA will remain in effect (meaning this test can be used) for the duration of the COVID-19 declaration under Section 564(b)(1) of the Act, 21 U.S.C. section 360bbb-3(b)(1), unless the authorization is terminated or revoked.  Performed at [facility], [place] [address], [place],  [address]    RN Pressure Injury Documentation:     Estimated body mass index is 23.98 kg/m as calculated from the following:   Height as of this encounter: 5\' 8"  (1.727 m).   Weight as of this encounter: 71.5 kg.  Malnutrition Type:   Malnutrition Characteristics:   Nutrition Interventions:    Radiology Studies: DG Chest 2 View  Result Date: 05/11/2021 CLINICAL DATA:  Chest pain EXAM: CHEST - 2 VIEW COMPARISON:  03/21/2021 FINDINGS: The lungs are clear without focal pneumonia, edema, pneumothorax or pleural effusion. The  cardiopericardial silhouette is within normal limits for size. The visualized bony structures of the thorax show no acute abnormality. Status post lower thoracic vertebral augmentation. Thoracic spinal stimulator device again noted. IMPRESSION: No active cardiopulmonary disease. Electronically Signed   By: Misty Stanley M.D.   On: 05/11/2021 07:37   CT ANGIO CHEST AORTA W/CM & OR WO/CM  Result Date: 05/11/2021 CLINICAL DATA:  Chest and back pain, initial encounter EXAM: CT ANGIOGRAPHY CHEST WITH CONTRAST TECHNIQUE: Multidetector CT imaging of the chest was performed using the standard protocol during bolus administration of intravenous contrast. Multiplanar CT image reconstructions and MIPs were obtained to evaluate the vascular anatomy. CONTRAST:  174mL OMNIPAQUE IOHEXOL 350 MG/ML SOLN COMPARISON:  Plain film from earlier in the same day. FINDINGS: Cardiovascular: Thoracic aorta demonstrates atherosclerotic calcification without aneurysmal dilatation or dissection. No cardiac enlargement is seen. Pulmonary artery is well visualized without evidence of pulmonary embolism. Scattered coronary calcifications are noted. Mediastinum/Nodes: Thoracic inlet is within normal limits. No sizable hilar or mediastinal adenopathy is noted. The esophagus as visualized is within normal limits. Lungs/Pleura: Lungs are well aerated bilaterally. Diffuse emphysematous changes are seen. Mild dependent atelectatic changes are noted. No focal infiltrate or effusion is noted. No parenchymal nodules are seen. Upper Abdomen: Right renal cyst is noted. No other focal abnormality in the upper abdomen is seen. Musculoskeletal: Degenerative changes of the thoracic spine are noted. No rib abnormality is noted. Changes of prior vertebral augmentation and spinal stimulator placement are noted. Review of the MIP images confirms the above findings. IMPRESSION: No evidence of aortic dissection. No pulmonary embolism is seen. Aortic Atherosclerosis  (ICD10-I70.0) and Emphysema (ICD10-J43.9). Electronically Signed   By: Inez Catalina M.D.   On: 05/11/2021 21:41    Scheduled Meds:  amLODipine  5 mg Oral Daily   aspirin EC  81 mg Oral Daily   lisinopril  10 mg Oral Daily   potassium chloride  40 mEq Oral BID   rosuvastatin  40 mg Oral Daily   traZODone  100 mg Oral QHS   Continuous Infusions:  heparin 1,050 Units/hr (05/12/21 0600)   nitroGLYCERIN 15 mcg/min (05/12/21 0716)    LOS: 1 day   Kerney Elbe, DO [facility] PAGER is on [place]  If 7PM-7AM, please contact night-coverage [email or web address]

## 2021-05-13 ENCOUNTER — Other Ambulatory Visit: Payer: Self-pay

## 2021-05-13 ENCOUNTER — Encounter (HOSPITAL_COMMUNITY)
Admission: EM | Disposition: A | Discharge status: Home / Self Care | Payer: Self-pay | Source: Home / Self Care | Attending: Internal Medicine

## 2021-05-13 DIAGNOSIS — E785 Hyperlipidemia, unspecified: Secondary | ICD-10-CM

## 2021-05-13 DIAGNOSIS — I25110 Atherosclerotic heart disease of native coronary artery with unstable angina pectoris: Principal | ICD-10-CM

## 2021-05-13 DIAGNOSIS — I200 Unstable angina: Secondary | ICD-10-CM

## 2021-05-13 DIAGNOSIS — I10 Essential (primary) hypertension: Secondary | ICD-10-CM

## 2021-05-13 DIAGNOSIS — D72829 Elevated white blood cell count, unspecified: Secondary | ICD-10-CM

## 2021-05-13 DIAGNOSIS — Z720 Tobacco use: Secondary | ICD-10-CM

## 2021-05-13 HISTORY — PX: LEFT HEART CATH AND CORONARY ANGIOGRAPHY: CATH118249

## 2021-05-13 HISTORY — PX: CORONARY STENT INTERVENTION: CATH118234

## 2021-05-13 LAB — CBC WITH DIFFERENTIAL/PLATELET
Abs Immature Granulocytes: 0.04 10*3/uL (ref 0.00–0.07)
Basophils Absolute: 0.1 10*3/uL (ref 0.0–0.1)
Basophils Relative: 1 %
Eosinophils Absolute: 0.3 10*3/uL (ref 0.0–0.5)
Eosinophils Relative: 3 %
HCT: 42.9 % (ref 39.0–52.0)
Hemoglobin: 14.4 g/dL (ref 13.0–17.0)
Immature Granulocytes: 0 %
Lymphocytes Relative: 28 %
Lymphs Abs: 3.1 10*3/uL (ref 0.7–4.0)
MCH: 31 pg (ref 26.0–34.0)
MCHC: 33.6 g/dL (ref 30.0–36.0)
MCV: 92.3 fL (ref 80.0–100.0)
Monocytes Absolute: 0.9 10*3/uL (ref 0.1–1.0)
Monocytes Relative: 8 %
Neutro Abs: 6.4 10*3/uL (ref 1.7–7.7)
Neutrophils Relative %: 60 %
Platelets: 212 10*3/uL (ref 150–400)
RBC: 4.65 MIL/uL (ref 4.22–5.81)
RDW: 12.6 % (ref 11.5–15.5)
WBC: 10.8 10*3/uL — ABNORMAL HIGH (ref 4.0–10.5)
nRBC: 0 % (ref 0.0–0.2)

## 2021-05-13 LAB — POCT ACTIVATED CLOTTING TIME
Activated Clotting Time: 257 seconds
Activated Clotting Time: 257 seconds
Activated Clotting Time: 305 seconds

## 2021-05-13 LAB — COMPREHENSIVE METABOLIC PANEL
ALT: 30 U/L (ref 0–44)
AST: 20 U/L (ref 15–41)
Albumin: 3.7 g/dL (ref 3.5–5.0)
Alkaline Phosphatase: 71 U/L (ref 38–126)
Anion gap: 7 (ref 5–15)
BUN: 10 mg/dL (ref 8–23)
CO2: 23 mmol/L (ref 22–32)
Calcium: 9.2 mg/dL (ref 8.9–10.3)
Chloride: 107 mmol/L (ref 98–111)
Creatinine, Ser: 0.77 mg/dL (ref 0.61–1.24)
GFR, Estimated: >60 mL/min (ref >60)
Glucose, Bld: 119 mg/dL — ABNORMAL HIGH (ref 70–99)
Potassium: 4.1 mmol/L (ref 3.5–5.1)
Sodium: 137 mmol/L (ref 135–145)
Total Bilirubin: 0.9 mg/dL (ref 0.3–1.2)
Total Protein: 6.2 g/dL — ABNORMAL LOW (ref 6.5–8.1)

## 2021-05-13 LAB — HEPARIN LEVEL (UNFRACTIONATED): Heparin Unfractionated: 0.37 IU/mL (ref 0.30–0.70)

## 2021-05-13 LAB — PHOSPHORUS: Phosphorus: 3.6 mg/dL (ref 2.5–4.6)

## 2021-05-13 LAB — MAGNESIUM: Magnesium: 2 mg/dL (ref 1.7–2.4)

## 2021-05-13 SURGERY — LEFT HEART CATH AND CORONARY ANGIOGRAPHY
Anesthesia: LOCAL

## 2021-05-13 MED ORDER — HYDRALAZINE HCL 20 MG/ML IJ SOLN: 10.0000 mg | INTRAMUSCULAR | Status: AC | PRN

## 2021-05-13 MED ORDER — TICAGRELOR 90 MG PO TABS
ORAL_TABLET | ORAL | Status: DC | PRN
  Administered 2021-05-13: 180 mg via ORAL

## 2021-05-13 MED ORDER — TICAGRELOR 90 MG PO TABS
90.0000 mg | ORAL_TABLET | Freq: Two times a day (BID) | ORAL | Status: DC
  Administered 2021-05-13 – 2021-05-14 (×2): 90 mg via ORAL
  Filled 2021-05-13 (×2): qty 1

## 2021-05-13 MED ORDER — SODIUM CHLORIDE 0.9% FLUSH: 3.0000 mL | Freq: Two times a day (BID) | INTRAVENOUS | Status: DC

## 2021-05-13 MED ORDER — SODIUM CHLORIDE 0.9 % IV SOLN: 250.0000 mL | INTRAVENOUS | Status: DC | PRN

## 2021-05-13 MED ORDER — HEPARIN SODIUM (PORCINE) 1000 UNIT/ML IJ SOLN
INTRAMUSCULAR | Status: DC | PRN
  Administered 2021-05-13: 2500 [IU] via INTRAVENOUS
  Administered 2021-05-13: 3500 [IU] via INTRAVENOUS
  Administered 2021-05-13: 2000 [IU] via INTRAVENOUS
  Administered 2021-05-13: 3500 [IU] via INTRAVENOUS

## 2021-05-13 MED ORDER — HEPARIN SODIUM (PORCINE) 1000 UNIT/ML IJ SOLN
INTRAMUSCULAR | Status: AC
  Filled 2021-05-13: qty 10

## 2021-05-13 MED ORDER — HEPARIN (PORCINE) IN NACL 1000-0.9 UT/500ML-% IV SOLN
INTRAVENOUS | Status: AC
  Filled 2021-05-13: qty 1000

## 2021-05-13 MED ORDER — VERAPAMIL HCL 2.5 MG/ML IV SOLN
INTRAVENOUS | Status: AC
  Filled 2021-05-13: qty 2

## 2021-05-13 MED ORDER — SODIUM CHLORIDE 0.9 % WEIGHT BASED INFUSION
1.0000 mL/kg/h | INTRAVENOUS | Status: DC
  Administered 2021-05-13: 1 mL/kg/h via INTRAVENOUS

## 2021-05-13 MED ORDER — ENOXAPARIN SODIUM 40 MG/0.4ML IJ SOSY
40.0000 mg | PREFILLED_SYRINGE | INTRAMUSCULAR | Status: DC
  Filled 2021-05-13: qty 0.4

## 2021-05-13 MED ORDER — LIDOCAINE HCL (PF) 1 % IJ SOLN
INTRAMUSCULAR | Status: DC | PRN
  Administered 2021-05-13: 2 mL

## 2021-05-13 MED ORDER — FENTANYL CITRATE (PF) 100 MCG/2ML IJ SOLN
INTRAMUSCULAR | Status: AC
  Filled 2021-05-13: qty 2

## 2021-05-13 MED ORDER — SODIUM CHLORIDE 0.9 % IV SOLN: INTRAVENOUS | Status: AC

## 2021-05-13 MED ORDER — FENTANYL CITRATE (PF) 100 MCG/2ML IJ SOLN
INTRAMUSCULAR | Status: DC | PRN
  Administered 2021-05-13: 25 ug via INTRAVENOUS
  Administered 2021-05-13: 50 ug via INTRAVENOUS

## 2021-05-13 MED ORDER — LABETALOL HCL 5 MG/ML IV SOLN: 10.0000 mg | INTRAVENOUS | Status: AC | PRN

## 2021-05-13 MED ORDER — LIDOCAINE HCL (PF) 1 % IJ SOLN
INTRAMUSCULAR | Status: AC
  Filled 2021-05-13: qty 30

## 2021-05-13 MED ORDER — IOHEXOL 350 MG/ML SOLN
INTRAVENOUS | Status: DC | PRN
  Administered 2021-05-13: 160 mL

## 2021-05-13 MED ORDER — NITROGLYCERIN 1 MG/10 ML FOR IR/CATH LAB
INTRA_ARTERIAL | Status: DC | PRN
  Administered 2021-05-13 (×3): 200 ug

## 2021-05-13 MED ORDER — ADULT MULTIVITAMIN W/MINERALS CH
1.0000 | ORAL_TABLET | Freq: Every day | ORAL | Status: DC
  Administered 2021-05-13 – 2021-05-14 (×2): 1 via ORAL
  Filled 2021-05-13 (×2): qty 1

## 2021-05-13 MED ORDER — ENSURE ENLIVE PO LIQD
237.0000 mL | Freq: Two times a day (BID) | ORAL | Status: DC
  Administered 2021-05-13 – 2021-05-14 (×2): 237 mL via ORAL

## 2021-05-13 MED ORDER — MIDAZOLAM HCL 2 MG/2ML IJ SOLN
INTRAMUSCULAR | Status: AC
  Filled 2021-05-13: qty 2

## 2021-05-13 MED ORDER — MIDAZOLAM HCL 2 MG/2ML IJ SOLN
INTRAMUSCULAR | Status: DC | PRN
  Administered 2021-05-13 (×2): 1 mg via INTRAVENOUS

## 2021-05-13 MED ORDER — SODIUM CHLORIDE 0.9% FLUSH: 3.0000 mL | INTRAVENOUS | Status: DC | PRN

## 2021-05-13 MED ORDER — SODIUM CHLORIDE 0.9% FLUSH
3.0000 mL | Freq: Two times a day (BID) | INTRAVENOUS | Status: DC
  Administered 2021-05-14: 3 mL via INTRAVENOUS

## 2021-05-13 MED ORDER — VERAPAMIL HCL 2.5 MG/ML IV SOLN
INTRAVENOUS | Status: DC | PRN
  Administered 2021-05-13: 10 mL via INTRA_ARTERIAL

## 2021-05-13 MED ORDER — NITROGLYCERIN 1 MG/10 ML FOR IR/CATH LAB
INTRA_ARTERIAL | Status: AC
  Filled 2021-05-13: qty 10

## 2021-05-13 MED ORDER — TICAGRELOR 90 MG PO TABS
ORAL_TABLET | ORAL | Status: AC
  Filled 2021-05-13: qty 2

## 2021-05-13 MED ORDER — ISOSORBIDE MONONITRATE ER 30 MG PO TB24
30.0000 mg | ORAL_TABLET | Freq: Every day | ORAL | Status: DC
  Administered 2021-05-13 – 2021-05-14 (×2): 30 mg via ORAL
  Filled 2021-05-13 (×2): qty 1

## 2021-05-13 MED ORDER — HEPARIN (PORCINE) IN NACL 1000-0.9 UT/500ML-% IV SOLN
INTRAVENOUS | Status: DC | PRN
  Administered 2021-05-13 (×2): 500 mL

## 2021-05-13 MED ORDER — NITROGLYCERIN IN D5W 200-5 MCG/ML-% IV SOLN
0.0000 ug/min | INTRAVENOUS | Status: DC
  Administered 2021-05-13: 10 ug/min via INTRAVENOUS

## 2021-05-13 SURGICAL SUPPLY — 20 items
BALLN SAPPHIRE ~~LOC~~ 3.0X10 (BALLOONS) ×2 IMPLANT
BALLN SAPPHIRE ~~LOC~~ 3.25X8 (BALLOONS) ×2 IMPLANT
BALLN SCOREFLEX 2.75X10 (BALLOONS) ×2
BALLOON SCOREFLEX 2.75X10 (BALLOONS) ×1 IMPLANT
CATH INFINITI JR4 5F (CATHETERS) ×2 IMPLANT
CATH OPTITORQUE TIG 4.0 5F (CATHETERS) ×2 IMPLANT
CATH VISTA GUIDE 6FR XBLAD3.5 (CATHETERS) ×2 IMPLANT
DEVICE RAD COMP TR BAND LRG (VASCULAR PRODUCTS) ×2 IMPLANT
GLIDESHEATH SLEND SS 6F .021 (SHEATH) ×2 IMPLANT
GUIDEWIRE INQWIRE 1.5J.035X260 (WIRE) ×1 IMPLANT
INQWIRE 1.5J .035X260CM (WIRE) ×2
KIT ENCORE 26 ADVANTAGE (KITS) ×2 IMPLANT
KIT HEART LEFT (KITS) ×2 IMPLANT
PACK CARDIAC CATHETERIZATION (CUSTOM PROCEDURE TRAY) ×2 IMPLANT
STENT ONYX FRONTIER 2.75X15 (Permanent Stent) ×2 IMPLANT
STENT ONYX FRONTIER 3.0X12 (Permanent Stent) ×2 IMPLANT
SYR MEDRAD MARK 7 150ML (SYRINGE) ×2 IMPLANT
TRANSDUCER W/STOPCOCK (MISCELLANEOUS) ×2 IMPLANT
TUBING CIL FLEX 10 FLL-RA (TUBING) ×2 IMPLANT
WIRE RUNTHROUGH .014X180CM (WIRE) ×2 IMPLANT

## 2021-05-13 NOTE — Progress Notes (Addendum)
Progress Note  Patient Name: Jordan Lambert Date of Encounter: 05/13/2021  [facility] Cardiologist: Carlyle Dolly, MD   Subjective   Still has mild 1-2/10 chest discomfort. Unable to titrate nitro drip due to soft blood pressure.   Inpatient Medications    Scheduled Meds:  amLODipine  5 mg Oral Daily   aspirin EC  81 mg Oral Daily   lisinopril  10 mg Oral Daily   rosuvastatin  40 mg Oral Daily   sodium chloride flush  3 mL Intravenous Q12H   traZODone  100 mg Oral QHS   Continuous Infusions:  sodium chloride     sodium chloride 1 mL/kg/hr (05/13/21 0600)   heparin 1,100 Units/hr (05/13/21 0600)   nitroGLYCERIN 20 mcg/min (05/13/21 0823)   PRN Meds: sodium chloride, acetaminophen, acetaminophen, HYDROmorphone, nitroGLYCERIN, ondansetron (ZOFRAN) IV, sodium chloride flush   Vital Signs    Vitals:   05/12/21 1456 05/13/21 0046 05/13/21 0351 05/13/21 0608  BP: 113/66 116/70 136/81   Pulse: 71 (!) 52 69 79  Resp: 17 12 13  (!) 21  Temp: 98 F (36.7 C)  97.8 F (36.6 C)   TempSrc: Oral  Oral   SpO2:   97% 98%  Weight:    70.9 kg  Height:        Intake/Output Summary (Last 24 hours) at 05/13/2021 0825 Last data filed at 05/13/2021 0600 Gross per 24 hour  Intake 1589.21 ml  Output 2375 ml  Net -785.79 ml   Last 3 Weights 05/13/2021 05/11/2021 05/11/2021  Weight (lbs) 156 lb 4.8 oz 157 lb 11.2 oz 162 lb  Weight (kg) 70.897 kg 71.532 kg 73.483 kg      Telemetry    Sinus bradycardia/rhythm  - Personally Reviewed  ECG    Sinus rhythm, PVCs - Personally Reviewed  Physical Exam   GEN: No acute distress.   Neck: No JVD Cardiac: RRR, no murmurs, rubs, or gallops.  Respiratory: Clear to auscultation bilaterally. GI: Soft, nontender, non-distended  MS: No edema; No deformity. Neuro:  Nonfocal  Psych: Normal affect   Labs    High Sensitivity Troponin:   Recent Labs  Lab 05/11/21 0703 05/11/21 0910  TROPONINIHS 6 6     Chemistry Recent Labs  Lab  05/11/21 0703 05/12/21 0322 05/13/21 0200  NA 137 137 137  K 3.9 3.4* 4.1  CL 105 104 107  CO2 24 23 23   GLUCOSE 151* 109* 119*  BUN 7* 9 10  CREATININE 0.81 0.81 0.77  CALCIUM 9.4 9.2 9.2  MG  --  2.0 2.0  PROT  --   --  6.2*  ALBUMIN  --   --  3.7  AST  --   --  20  ALT  --   --  30  ALKPHOS  --   --  71  BILITOT  --   --  0.9  GFRNONAA >60 >60 >60  ANIONGAP 8 10 7     Lipids No results for input(s): CHOL, TRIG, HDL, LABVLDL, LDLCALC, CHOLHDL in the last 168 hours.  Hematology Recent Labs  Lab 05/11/21 0703 05/12/21 0322 05/13/21 0200  WBC 10.2 9.8 10.8*  RBC 4.73 4.71 4.65  HGB 14.6 14.7 14.4  HCT 43.4 42.7 42.9  MCV 91.8 90.7 92.3  MCH 30.9 31.2 31.0  MCHC 33.6 34.4 33.6  RDW 12.2 12.1 12.6  PLT 235 204 212    Radiology    CT ANGIO CHEST AORTA W/CM & OR WO/CM  Result Date: 05/11/2021  CLINICAL DATA:  Chest and back pain, initial encounter EXAM: CT ANGIOGRAPHY CHEST WITH CONTRAST TECHNIQUE: Multidetector CT imaging of the chest was performed using the standard protocol during bolus administration of intravenous contrast. Multiplanar CT image reconstructions and MIPs were obtained to evaluate the vascular anatomy. CONTRAST:  11mL OMNIPAQUE IOHEXOL 350 MG/ML SOLN COMPARISON:  Plain film from earlier in the same day. FINDINGS: Cardiovascular: Thoracic aorta demonstrates atherosclerotic calcification without aneurysmal dilatation or dissection. No cardiac enlargement is seen. Pulmonary artery is well visualized without evidence of pulmonary embolism. Scattered coronary calcifications are noted. Mediastinum/Nodes: Thoracic inlet is within normal limits. No sizable hilar or mediastinal adenopathy is noted. The esophagus as visualized is within normal limits. Lungs/Pleura: Lungs are well aerated bilaterally. Diffuse emphysematous changes are seen. Mild dependent atelectatic changes are noted. No focal infiltrate or effusion is noted. No parenchymal nodules are seen. Upper  Abdomen: Right renal cyst is noted. No other focal abnormality in the upper abdomen is seen. Musculoskeletal: Degenerative changes of the thoracic spine are noted. No rib abnormality is noted. Changes of prior vertebral augmentation and spinal stimulator placement are noted. Review of the MIP images confirms the above findings. IMPRESSION: No evidence of aortic dissection. No pulmonary embolism is seen. Aortic Atherosclerosis (ICD10-I70.0) and Emphysema (ICD10-J43.9). Electronically Signed   By: Inez Catalina M.D.   On: 05/11/2021 21:41   ECHOCARDIOGRAM COMPLETE  Result Date: 05/12/2021    ECHOCARDIOGRAM REPORT   Patient Name:   Jordan Lambert Date of Exam: 05/12/2021 Medical Rec #:  [id number]     Height:       68.0 in Accession #:    [id number]    Weight:       157.7 lb Date of Birth:  [birth year]     BSA:          1.847 m Patient Age:    65 years      BP:           171/94 mmHg Patient Gender: M             HR:           81 bpm. Exam Location:  Inpatient Procedure: 2D Echo, Cardiac Doppler and Color Doppler Indications:    chest pain  History:        Patient has no prior history of Echocardiogram examinations.                 Risk Factors:Hypertension and Dyslipidemia.  Sonographer:    Melissa Morford RDCS (AE, PE) Referring Phys: [id number] [place]  1. Mild hypokinesis of the base/mid inferior/inferoseptal walls. . Left ventricular ejection fraction, by estimation, is 50 to 55%. The left ventricle has low normal function.  2. Right ventricular systolic function is normal. The right ventricular size is normal.  3. Trivial mitral valve regurgitation.  4. The aortic valve is tricuspid. Aortic valve regurgitation is not visualized. Aortic valve sclerosis is present, with no evidence of aortic valve stenosis. FINDINGS  Left Ventricle: Mild hypokinesis of the base/mid inferior/inferoseptal walls. Left ventricular ejection fraction, by estimation, is 50 to 55%. The left ventricle has low normal  function. The left ventricular internal cavity size was normal in size. There is no left ventricular hypertrophy. Right Ventricle: The right ventricular size is normal. Right vetricular wall thickness was not assessed. Right ventricular systolic function is normal. Left Atrium: Left atrial size was normal in size. Right Atrium: Right atrial size was normal in size. Pericardium: There is no  evidence of pericardial effusion. Mitral Valve: There is mild thickening of the mitral valve leaflet(s). Trivial mitral valve regurgitation. Tricuspid Valve: The tricuspid valve is normal in structure. Tricuspid valve regurgitation is trivial. Aortic Valve: The aortic valve is tricuspid. Aortic valve regurgitation is not visualized. Aortic valve sclerosis is present, with no evidence of aortic valve stenosis. Pulmonic Valve: The pulmonic valve was normal in structure. Pulmonic valve regurgitation is trivial. Aorta: The aortic root is normal in size and structure. IAS/Shunts: No atrial level shunt detected by color flow Doppler.  LEFT VENTRICLE PLAX 2D LVIDd:         4.90 cm     Diastology LVIDs:         3.40 cm     LV e' medial:    5.66 cm/s LV PW:         0.90 cm     LV E/e' medial:  13.6 LV IVS:        0.90 cm     LV e' lateral:   8.05 cm/s LVOT diam:     2.20 cm     LV E/e' lateral: 9.6 LV SV:         70 LV SV Index:   38 LVOT Area:     3.80 cm  LV Volumes (MOD) LV vol d, MOD A2C: 74.5 ml LV vol d, MOD A4C: 56.8 ml LV vol s, MOD A2C: 37.5 ml LV vol s, MOD A4C: 36.2 ml LV SV MOD A2C:     37.0 ml LV SV MOD A4C:     56.8 ml LV SV MOD BP:      29.6 ml RIGHT VENTRICLE TAPSE (M-mode): 2.4 cm LEFT ATRIUM             Index        RIGHT ATRIUM           Index LA diam:        4.30 cm 2.33 cm/m   RA Area:     14.40 cm LA Vol (A2C):   37.0 ml 20.03 ml/m  RA Volume:   34.10 ml  18.46 ml/m LA Vol (A4C):   59.5 ml 32.21 ml/m LA Biplane Vol: 47.5 ml 25.71 ml/m  AORTIC VALVE LVOT Vmax:   95.80 cm/s LVOT Vmean:  63.300 cm/s LVOT VTI:     0.185 m  AORTA Ao Root diam: 3.00 cm Ao Asc diam:  2.90 cm MITRAL VALVE MV Area (PHT): 3.50 cm    SHUNTS MV Decel Time: 217 msec    Systemic VTI:  0.18 m MV E velocity: 77.10 cm/s  Systemic Diam: 2.20 cm MV A velocity: 93.40 cm/s MV E/A ratio:  0.83 Dorris Carnes MD Electronically signed by Dorris Carnes MD Signature Date/Time: 05/12/2021/1:36:06 PM    Final     Cardiac Studies   Echo 05/12/21 1. Mild hypokinesis of the base/mid inferior/inferoseptal walls. . Left  ventricular ejection fraction, by estimation, is 50 to 55%. The left  ventricle has low normal function.   2. Right ventricular systolic function is normal. The right ventricular  size is normal.   3. Trivial mitral valve regurgitation.   4. The aortic valve is tricuspid. Aortic valve regurgitation is not  visualized. Aortic valve sclerosis is present, with no evidence of aortic  valve stenosis.   CT coronary 04/22/21 FINDINGS: CT-FFR analysis was performed on the original cardiac CT angiogram dataset. Diagrammatic representation of the CT-FFR analysis is provided in a separate PDF document in PACS. This  dictation was created using the PDF document and an interactive 3D model of the results. 3D model is not available in the EMR/PACS. Normal FFR range is >0.80.   1. Left Main: No significant functional stenosis.   2. LAD: significant functional stenosis, CT-FFR 0.83-> 0.77 at mLAD lesion. Ostial and Proximal LAD lesions do not show significant functional stenosis. 3. LCX: No significant functional stenosis, at [facility], CT-FFR 0.89-> 0.85. 4. RCA: Modeled as CTO after mRCA lesion.   IMPRESSION: 1. CT FFR analysis shows evidence of significant functional stenosis.  Patient Profile     65 y.o. male with HTN, tobacco use and known CAD where CTA demonstrated mid LAD lesion with positive FFR and RCA CTO who presented with chest tightness concerning for UA.  Assessment & Plan    Unstable angina/CAD - Outpatient abnormal coronary  CTA > for cath on 12/7 however presented with chest pain. CTA angio of chest negative for aortic dissection.  - Has mild chest pain - For cath later today  - Echo showed LVEF of 50-55% and mild hypokinesis of the base/mid inferior/inferoseptal walls. (Per Dr. Harl Bowie note "03/2021 echo pcp: LVEF 70%, grade I dd") - HR 50-70s. Add BB iF HR stable post cath  - Continue heparin and nitro drop (as blood pressure allows) - Continue ASA and statin   2. HTN - No results found for requested labs within last 8760 hours.  - Continue Crestor 40  3. HTN - Continue Amlodipine and Lisinopril  4. Tobacco smoking - Cessation advised    For questions or updates, please contact [facility] Please consult [email or web address] for contact info under        SignedLeanor Kail, PA  05/13/2021, 8:25 AM      Patient seen and examined. Agree with assessment and plan.  Significantly elevated coronary calcium score at 1313 with multivessel coronary calcification.  Echo revealed low normal LV function with mild hypokinesis of the basal to mid inferior and inferoseptal walls.  Presently patient has residual 1-2/10 chest pain.  Blood pressure has elevated to 170/107.  Nitroglycerin drip has been titrated to 30 mcg.  Reviewed the patient's coronary CT data with he and his wife.  Anticipate finding vessel disease total stenosis and FFR involving the LAD and RCA. I have reviewed the risks, indications, and alternatives to cardiac catheterization, possible angioplasty, and stenting with the patient. Risks include but are not limited to bleeding, infection, vascular injury, stroke, myocardial infection, arrhythmia, kidney injury, radiation-related injury in the case of prolonged fluoroscopy use, emergency cardiac surgery, and death. The patient understands the risks of serious complication is 1-2 in 2197 with diagnostic cardiac cath and 1-2% or less with angioplasty/stenting.  I discussed potential need for coronary  intervention with stenting, possible need for atherectomy, or possible CABG revascularization depending upon anatomical findings.  I discussed the importance of complete smoking cessation in this patient who has been smoking for approximately 50 years.   Troy Sine, MD, [facility] 05/13/2021 9:43 AM

## 2021-05-13 NOTE — Progress Notes (Addendum)
PROGRESS NOTE    Jordan Lambert  RKY:[id number] DOB: 12-Mar-1956 DOA: 05/11/2021 PCP: Monico Blitz, MD   Brief Narrative:  The patient is a 65 year old Caucasian male with a past medical history significant for but not limited to CAD, hypertension, chronic systolic CHF with an LVEF of 46% on stress testing in 2018, OSA not on CPAP, peripheral neuropathy as well as other comorbidities who came in with recurrent chest pain.  Patient started to have intermittent chest pain about 5 to 6 weeks ago when he went to [facility] on 03/21/2021 and troponins were negative and PE study was negative and he was discharged home.  Subsequently patient had several episodes of chest pain and cardiology did a coronary CTA which showed mid LAD greater than 70% stenosis on 04/22/2021.  He is scheduled to have a cardiac cath on 05/15/2021.  The day before admission in the evening after dinner around 7 PM the patient started having another episode of chest pain felt like a squeezing that radiated to his left arm and he described it as a 9 out of 10 in severity but denied any palpitations, sweating nausea or vomiting.  Checked his blood pressure is noted to be current 315 systolic.  He took 181 mg of aspirin and chest pain subsided.  On the morning of admission the chest pain came back so he decided come to the ED for further evaluation.  He is noted to have significantly elevated blood pressure and EKG showed chronic ST changes in leads II, III and aVF.  Troponin was negative x2.  Cardiology evaluated patient was placed on a heparin drip and nitroglycerin drip.  Currently he is being admitted and treated for chest pain with concern for unstable angina.  Cardiology is following and planning a cardiac catheterization later today  Assessment & Plan:   Principal Problem:   Angina at rest [facility]) Active Problems:   Angina pectoris ([place])  Chest pain rule out ACS with concern for unstable angina at the setting of known LAD  disease -Presented with substernal chest tightness that has been going on since the day before admission his evening in the setting of significant mid LAD disease with a positive FFR noted on CTA of the chest -Cycle troponins were negative x2 as needed 6 both times -EKG did not show any ischemic changes -CTA Chest Aorta showed "No evidence of aortic dissection. No pulmonary embolism is seen. Aortic Atherosclerosis and Emphysema."  -Cardiology consulted and recommending cardiac cath this admission -Given that he continued have significant pain they started and initiated a nitroglycerin drip as well as a heparin drip and recommending continue to monitor for symptoms -They do not feel that the cath is urgent at this time unless symptoms are refractory to EKG changes or if troponins rise -We will continue his ACS meds including a heparin drip, aspirin 81 mg p.o. daily, rosuvastatin 40 mg nightly, lisinopril 10 mg p.o. daily -Patient had TTE done yesterday which showed "Mild hypokinesis of the base/mid inferior/inferoseptal walls. Left ventricular ejection fraction, by estimation, is 50 to 55%. The left  ventricle has low normal function. Right ventricular systolic function is normal. The right ventricular size is normal. Trivial mitral valve regurgitation. The aortic valve is tricuspid. Aortic valve regurgitation is not visualized. Aortic valve sclerosis is present, with no evidence of aortic valve stenosis."  -Also has NTG 0.4 mg slL q10minprn Chest Pain  -His beta-blocker has been held due to his bradycardia; Last HR was documented to be 58 -  Cardiology is planning a cardiac catheterization today and patient is still having some mild chest pain 1 or 2 out of 10 in severity -Further care per cardiology -Continue to monitor on telemetry  Hypokalemia -Mild. Potassium was 3.4 a and improved to 4.1 after repletion -We will check magnesium level is now 2.0 -Continue to monitor and replete as  necessary -Repeat CMP in a.m.  Headache -From Nitro gtt -Try Acetaminophen 1000 mg po q6hprn Headache -Continue to monitor as patient still has a headache and may need Fioricet  Leg Pain with Chronic Nerve Damage/Chronic Neck Pain  -Has Neuropathy and Chronic Leg Pain from falling out of a tree and getting Cervical Fusion and 3-4 Anterior cervical decompression/discectomy fusion -C/w Hydromorphone 4 mg po q6hprn Moderate Pain -If Necessary will add meds for breakthrough pain and will consider Mary gabapentin  Leukocytosis -Mild and Likely Reactive -Patient's WBC went from 9.8 -> 10.8 -Continue to Monitor for S/Sx of Infection -Repeat CBC in the AM   HLD -C/w Rosuvastatin 40 mg po Daily    Uncontrolled Hypertension -On nitroglycerin drip -Resumed Lisinopril 10 mg po Daily and has now been started on Amlodipine 5 mg po daily by Cardiology -Continue to monitor blood pressures per protocol -Last blood pressure reading was 505/39   Chronic Systolic CHF -Euvolemic, aggressive BP management. -Strict I's and O's and Daily Weights; Patient is - 878.1 liters since Admission  Tobacco Abuse -Smoking Cessation Counseling given   OSA -Dose not use CPAP  DVT prophylaxis: Anticoagulated with Heparin gtt Code Status: FULL CODE Family Communication: Discussed with wife at bedside Disposition Plan: Pending further clinical improvement and clearance by Cardiology   Status is: Inpatient  Remains inpatient appropriate because: Plan is for Cardiac Cath  Consultants:  Cardiology   Procedures: ECHOCARDIOGRAM  IMPRESSIONS     1. Mild hypokinesis of the base/mid inferior/inferoseptal walls. . Left  ventricular ejection fraction, by estimation, is 50 to 55%. The left  ventricle has low normal function.   2. Right ventricular systolic function is normal. The right ventricular  size is normal.   3. Trivial mitral valve regurgitation.   4. The aortic valve is tricuspid. Aortic valve  regurgitation is not  visualized. Aortic valve sclerosis is present, with no evidence of aortic  valve stenosis.   FINDINGS   Left Ventricle: Mild hypokinesis of the base/mid inferior/inferoseptal  walls. Left ventricular ejection fraction, by estimation, is 50 to 55%.  The left ventricle has low normal function. The left ventricular internal  cavity size was normal in size.  There is no left ventricular hypertrophy.   Right Ventricle: The right ventricular size is normal. Right vetricular  wall thickness was not assessed. Right ventricular systolic function is  normal.   Left Atrium: Left atrial size was normal in size.   Right Atrium: Right atrial size was normal in size.   Pericardium: There is no evidence of pericardial effusion.   Mitral Valve: There is mild thickening of the mitral valve leaflet(s).  Trivial mitral valve regurgitation.   Tricuspid Valve: The tricuspid valve is normal in structure. Tricuspid  valve regurgitation is trivial.   Aortic Valve: The aortic valve is tricuspid. Aortic valve regurgitation is  not visualized. Aortic valve sclerosis is present, with no evidence of  aortic valve stenosis.   Pulmonic Valve: The pulmonic valve was normal in structure. Pulmonic valve  regurgitation is trivial.   Aorta: The aortic root is normal in size and structure.   IAS/Shunts: No atrial level shunt  detected by color flow Doppler.      LEFT VENTRICLE  PLAX 2D  LVIDd:         4.90 cm     Diastology  LVIDs:         3.40 cm     LV e' medial:    5.66 cm/s  LV PW:         0.90 cm     LV E/e' medial:  13.6  LV IVS:        0.90 cm     LV e' lateral:   8.05 cm/s  LVOT diam:     2.20 cm     LV E/e' lateral: 9.6  LV SV:         70  LV SV Index:   38  LVOT Area:     3.80 cm     LV Volumes (MOD)  LV vol d, MOD A2C: 74.5 ml  LV vol d, MOD A4C: 56.8 ml  LV vol s, MOD A2C: 37.5 ml  LV vol s, MOD A4C: 36.2 ml  LV SV MOD A2C:     37.0 ml  LV SV MOD A4C:     56.8 ml   LV SV MOD BP:      29.6 ml   RIGHT VENTRICLE  TAPSE (M-mode): 2.4 cm   LEFT ATRIUM             Index        RIGHT ATRIUM           Index  LA diam:        4.30 cm 2.33 cm/m   RA Area:     14.40 cm  LA Vol (A2C):   37.0 ml 20.03 ml/m  RA Volume:   34.10 ml  18.46 ml/m  LA Vol (A4C):   59.5 ml 32.21 ml/m  LA Biplane Vol: 47.5 ml 25.71 ml/m   AORTIC VALVE  LVOT Vmax:   95.80 cm/s  LVOT Vmean:  63.300 cm/s  LVOT VTI:    0.185 m     AORTA  Ao Root diam: 3.00 cm  Ao Asc diam:  2.90 cm   MITRAL VALVE  MV Area (PHT): 3.50 cm    SHUNTS  MV Decel Time: 217 msec    Systemic VTI:  0.18 m  MV E velocity: 77.10 cm/s  Systemic Diam: 2.20 cm  MV A velocity: 93.40 cm/s  MV E/A ratio:  0.83   Antimicrobials:  Anti-infectives (From admission, onward)    None        Subjective: Seen and examined at bedside and he states that he still having a headache and still having some leg pain.  Also states that he developed some more chest pain this morning and described as a 1 or 2 out of 10 in severity.  No lightheadedness or dizziness.  Awaiting for cardiac catheterization I do not know what time he will be going.  No other concerns or complaints at this time.  Objective: Vitals:   05/12/21 1456 05/13/21 0046 05/13/21 0351 05/13/21 0608  BP: 113/66 116/70 136/81   Pulse: 71 (!) 52 69 79  Resp: 17 12 13  (!) 21  Temp: 98 F (36.7 C)  97.8 F (36.6 C)   TempSrc: Oral  Oral   SpO2:   97% 98%  Weight:    70.9 kg  Height:        Intake/Output Summary (Last 24 hours) at 05/13/2021 3300 Last data filed at  05/13/2021 0900 Gross per 24 hour  Intake 1589.21 ml  Output 2375 ml  Net -785.79 ml    Filed Weights   05/11/21 0643 05/11/21 1649 05/13/21 8841  Weight: 73.5 kg 71.5 kg 70.9 kg   Examination: Physical Exam:  Constitutional: WN/WD Caucasian male currently no acute distress appears calm and complaining of some mild chest pain Eyes: Lids and conjunctivae normal, sclerae  anicteric  ENMT: External Ears, Nose appear normal. Grossly normal hearing. Mucous membranes are moist. Neck: Appears normal, supple, no cervical masses, normal ROM, no appreciable thyromegaly; no appreciable JVD Respiratory: Diminished to auscultation bilaterally with coarse breath sounds, no wheezing, rales, rhonchi or crackles. Normal respiratory effort and patient is not tachypenic. No accessory muscle use.  Unlabored breathing Cardiovascular: RRR, no murmurs / rubs / gallops. S1 and S2 auscultated.  Slight extremity edema Abdomen: Soft, non-tender, non-distended. Bowel sounds positive.  GU: Deferred. Musculoskeletal: No clubbing / cyanosis of digits/nails. No joint deformity upper and lower extremities.  Skin: No rashes, lesions, ulcers on a limited skin evaluation. No induration; Warm and dry.  Neurologic: CN 2-12 grossly intact with no focal deficits. Romberg sign and cerebellar reflexes not assessed.  Psychiatric: Normal judgment and insight. Alert and oriented x 3. Normal mood and appropriate affect.   Data Reviewed: I have personally reviewed following labs and imaging studies  CBC: Recent Labs  Lab 05/11/21 0703 05/12/21 0322 05/13/21 0200  WBC 10.2 9.8 10.8*  NEUTROABS  --   --  6.4  HGB 14.6 14.7 14.4  HCT 43.4 42.7 42.9  MCV 91.8 90.7 92.3  PLT 235 204 660    Basic Metabolic Panel: Recent Labs  Lab 05/11/21 0703 05/12/21 0322 05/13/21 0200  NA 137 137 137  K 3.9 3.4* 4.1  CL 105 104 107  CO2 24 23 23   GLUCOSE 151* 109* 119*  BUN 7* 9 10  CREATININE 0.81 0.81 0.77  CALCIUM 9.4 9.2 9.2  MG  --  2.0 2.0  PHOS  --  4.0 3.6    GFR: Estimated Creatinine Clearance: 89.1 mL/min (by C-G formula based on SCr of 0.77 mg/dL). Liver Function Tests: Recent Labs  Lab 05/13/21 0200  AST 20  ALT 30  ALKPHOS 71  BILITOT 0.9  PROT 6.2*  ALBUMIN 3.7   No results for input(s): LIPASE, AMYLASE in the last 168 hours. No results for input(s): AMMONIA in the last 168  hours. Coagulation Profile: No results for input(s): INR, PROTIME in the last 168 hours. Cardiac Enzymes: No results for input(s): CKTOTAL, CKMB, CKMBINDEX, TROPONINI in the last 168 hours. BNP (last 3 results) No results for input(s): PROBNP in the last 8760 hours. HbA1C: No results for input(s): HGBA1C in the last 72 hours. CBG: Recent Labs  Lab 05/11/21 1706  GLUCAP 98    Lipid Profile: No results for input(s): CHOL, HDL, LDLCALC, TRIG, CHOLHDL, LDLDIRECT in the last 72 hours. Thyroid Function Tests: No results for input(s): TSH, T4TOTAL, FREET4, T3FREE, THYROIDAB in the last 72 hours. Anemia Panel: No results for input(s): VITAMINB12, FOLATE, FERRITIN, TIBC, IRON, RETICCTPCT in the last 72 hours. Sepsis Labs: No results for input(s): PROCALCITON, LATICACIDVEN in the last 168 hours.  Recent Results (from the past 240 hour(s))  Resp Panel by RT-PCR (Flu A&B, Covid) Nasopharyngeal Swab     Status: None   Collection Time: 05/11/21  9:01 AM   Specimen: Nasopharyngeal Swab; Nasopharyngeal(NP) swabs in vial transport medium  Result Value Ref Range Status   SARS Coronavirus 2  by RT PCR NEGATIVE NEGATIVE Final    Comment: (NOTE) SARS-CoV-2 target nucleic acids are NOT DETECTED.  The SARS-CoV-2 RNA is generally detectable in upper respiratory specimens during the acute phase of infection. The lowest concentration of SARS-CoV-2 viral copies this assay can detect is 138 copies/mL. A negative result does not preclude SARS-Cov-2 infection and should not be used as the sole basis for treatment or other patient management decisions. A negative result may occur with  improper specimen collection/handling, submission of specimen other than nasopharyngeal swab, presence of viral mutation(s) within the areas targeted by this assay, and inadequate number of viral copies(<138 copies/mL). A negative result must be combined with clinical observations, patient history, and  epidemiological information. The expected result is Negative.  Fact Sheet for Patients:  EntrepreneurPulse.com.au  Fact Sheet for Healthcare Providers:  IncredibleEmployment.be  This test is no t yet approved or cleared by the [place] FDA and  has been authorized for detection and/or diagnosis of SARS-CoV-2 by FDA under an Emergency Use Authorization (EUA). This EUA will remain  in effect (meaning this test can be used) for the duration of the COVID-19 declaration under Section 564(b)(1) of the Act, 21 U.S.C.section 360bbb-3(b)(1), unless the authorization is terminated  or revoked sooner.       Influenza A by PCR NEGATIVE NEGATIVE Final   Influenza B by PCR NEGATIVE NEGATIVE Final    Comment: (NOTE) The Xpert Xpress SARS-CoV-2/FLU/RSV plus assay is intended as an aid in the diagnosis of influenza from Nasopharyngeal swab specimens and should not be used as a sole basis for treatment. Nasal washings and aspirates are unacceptable for Xpert Xpress SARS-CoV-2/FLU/RSV testing.  Fact Sheet for Patients: EntrepreneurPulse.com.au  Fact Sheet for Healthcare Providers: IncredibleEmployment.be  This test is not yet approved or cleared by the [place] FDA and has been authorized for detection and/or diagnosis of SARS-CoV-2 by FDA under an Emergency Use Authorization (EUA). This EUA will remain in effect (meaning this test can be used) for the duration of the COVID-19 declaration under Section 564(b)(1) of the Act, 21 U.S.C. section 360bbb-3(b)(1), unless the authorization is terminated or revoked.  Performed at [facility], [place] [address], [place], [place] [address]    RN Pressure Injury Documentation:     Estimated body mass index is 23.77 kg/m as calculated from the following:   Height as of this encounter: 5\' 8"  (1.727 m).   Weight as of this encounter: 70.9 kg.  Malnutrition Type:    Malnutrition Characteristics:   Nutrition Interventions:    Radiology Studies: CT ANGIO CHEST AORTA W/CM & OR WO/CM  Result Date: 05/11/2021 CLINICAL DATA:  Chest and back pain, initial encounter EXAM: CT ANGIOGRAPHY CHEST WITH CONTRAST TECHNIQUE: Multidetector CT imaging of the chest was performed using the standard protocol during bolus administration of intravenous contrast. Multiplanar CT image reconstructions and MIPs were obtained to evaluate the vascular anatomy. CONTRAST:  165mL OMNIPAQUE IOHEXOL 350 MG/ML SOLN COMPARISON:  Plain film from earlier in the same day. FINDINGS: Cardiovascular: Thoracic aorta demonstrates atherosclerotic calcification without aneurysmal dilatation or dissection. No cardiac enlargement is seen. Pulmonary artery is well visualized without evidence of pulmonary embolism. Scattered coronary calcifications are noted. Mediastinum/Nodes: Thoracic inlet is within normal limits. No sizable hilar or mediastinal adenopathy is noted. The esophagus as visualized is within normal limits. Lungs/Pleura: Lungs are well aerated bilaterally. Diffuse emphysematous changes are seen. Mild dependent atelectatic changes are noted. No focal infiltrate or effusion is noted. No parenchymal nodules are seen.  Upper Abdomen: Right renal cyst is noted. No other focal abnormality in the upper abdomen is seen. Musculoskeletal: Degenerative changes of the thoracic spine are noted. No rib abnormality is noted. Changes of prior vertebral augmentation and spinal stimulator placement are noted. Review of the MIP images confirms the above findings. IMPRESSION: No evidence of aortic dissection. No pulmonary embolism is seen. Aortic Atherosclerosis (ICD10-I70.0) and Emphysema (ICD10-J43.9). Electronically Signed   By: Inez Catalina M.D.   On: 05/11/2021 21:41   ECHOCARDIOGRAM COMPLETE  Result Date: 05/12/2021    ECHOCARDIOGRAM REPORT   Patient Name:   Jordan Lambert Height Date of Exam: 05/12/2021 Medical Rec #:   [id number]     Height:       68.0 in Accession #:    [id number]    Weight:       157.7 lb Date of Birth:  1956/04/30     BSA:          1.847 m Patient Age:    18 years      BP:           171/94 mmHg Patient Gender: M             HR:           81 bpm. Exam Location:  Inpatient Procedure: 2D Echo, Cardiac Doppler and Color Doppler Indications:    chest pain  History:        Patient has no prior history of Echocardiogram examinations.                 Risk Factors:Hypertension and Dyslipidemia.  Sonographer:    Melissa Morford RDCS (AE, PE) Referring Phys: [id number] [place]  1. Mild hypokinesis of the base/mid inferior/inferoseptal walls. . Left ventricular ejection fraction, by estimation, is 50 to 55%. The left ventricle has low normal function.  2. Right ventricular systolic function is normal. The right ventricular size is normal.  3. Trivial mitral valve regurgitation.  4. The aortic valve is tricuspid. Aortic valve regurgitation is not visualized. Aortic valve sclerosis is present, with no evidence of aortic valve stenosis. FINDINGS  Left Ventricle: Mild hypokinesis of the base/mid inferior/inferoseptal walls. Left ventricular ejection fraction, by estimation, is 50 to 55%. The left ventricle has low normal function. The left ventricular internal cavity size was normal in size. There is no left ventricular hypertrophy. Right Ventricle: The right ventricular size is normal. Right vetricular wall thickness was not assessed. Right ventricular systolic function is normal. Left Atrium: Left atrial size was normal in size. Right Atrium: Right atrial size was normal in size. Pericardium: There is no evidence of pericardial effusion. Mitral Valve: There is mild thickening of the mitral valve leaflet(s). Trivial mitral valve regurgitation. Tricuspid Valve: The tricuspid valve is normal in structure. Tricuspid valve regurgitation is trivial. Aortic Valve: The aortic valve is tricuspid. Aortic valve  regurgitation is not visualized. Aortic valve sclerosis is present, with no evidence of aortic valve stenosis. Pulmonic Valve: The pulmonic valve was normal in structure. Pulmonic valve regurgitation is trivial. Aorta: The aortic root is normal in size and structure. IAS/Shunts: No atrial level shunt detected by color flow Doppler.  LEFT VENTRICLE PLAX 2D LVIDd:         4.90 cm     Diastology LVIDs:         3.40 cm     LV e' medial:    5.66 cm/s LV PW:         0.90 cm  LV E/e' medial:  13.6 LV IVS:        0.90 cm     LV e' lateral:   8.05 cm/s LVOT diam:     2.20 cm     LV E/e' lateral: 9.6 LV SV:         70 LV SV Index:   38 LVOT Area:     3.80 cm  LV Volumes (MOD) LV vol d, MOD A2C: 74.5 ml LV vol d, MOD A4C: 56.8 ml LV vol s, MOD A2C: 37.5 ml LV vol s, MOD A4C: 36.2 ml LV SV MOD A2C:     37.0 ml LV SV MOD A4C:     56.8 ml LV SV MOD BP:      29.6 ml RIGHT VENTRICLE TAPSE (M-mode): 2.4 cm LEFT ATRIUM             Index        RIGHT ATRIUM           Index LA diam:        4.30 cm 2.33 cm/m   RA Area:     14.40 cm LA Vol (A2C):   37.0 ml 20.03 ml/m  RA Volume:   34.10 ml  18.46 ml/m LA Vol (A4C):   59.5 ml 32.21 ml/m LA Biplane Vol: 47.5 ml 25.71 ml/m  AORTIC VALVE LVOT Vmax:   95.80 cm/s LVOT Vmean:  63.300 cm/s LVOT VTI:    0.185 m  AORTA Ao Root diam: 3.00 cm Ao Asc diam:  2.90 cm MITRAL VALVE MV Area (PHT): 3.50 cm    SHUNTS MV Decel Time: 217 msec    Systemic VTI:  0.18 m MV E velocity: 77.10 cm/s  Systemic Diam: 2.20 cm MV A velocity: 93.40 cm/s MV E/A ratio:  0.83 Dorris Carnes MD Electronically signed by Dorris Carnes MD Signature Date/Time: 05/12/2021/1:36:06 PM    Final     Scheduled Meds:  amLODipine  5 mg Oral Daily   aspirin EC  81 mg Oral Daily   lisinopril  10 mg Oral Daily   rosuvastatin  40 mg Oral Daily   sodium chloride flush  3 mL Intravenous Q12H   traZODone  100 mg Oral QHS   Continuous Infusions:  sodium chloride     sodium chloride 1 mL/kg/hr (05/13/21 0600)   heparin 1,100  Units/hr (05/13/21 0600)   nitroGLYCERIN 20 mcg/min (05/13/21 0823)    LOS: 2 days   Kerney Elbe, DO [facility] PAGER is on AMION  If 7PM-7AM, please contact night-coverage [email or web address]

## 2021-05-13 NOTE — Progress Notes (Signed)
ANTICOAGULATION CONSULT NOTE - Follow Up Consult Pharmacy Consult for Heparin drip Indication: chest pain/ACS  Allergies  Allergen Reactions   Sulfamethoxazole-Trimethoprim Rash    Sores in mouth    Patient Measurements: Height: 5\' 8"  (172.7 cm) Weight: 70.9 kg (156 lb 4.8 oz) IBW/kg (Calculated) : 68.4 Heparin Dosing Weight: 73.5 kg  Vital Signs: Temp: 97.8 F (36.6 C) (12/05 0351) Temp Source: Oral (12/05 0351) BP: 136/81 (12/05 0351) Pulse Rate: 79 (12/05 0608)  Labs: Recent Labs    05/11/21 0703 05/11/21 0910 05/11/21 1528 05/12/21 0322 05/12/21 1135 05/13/21 0200  HGB 14.6  --   --  14.7  --  14.4  HCT 43.4  --   --  42.7  --  42.9  PLT 235  --   --  204  --  212  HEPARINUNFRC  --   --    < > 0.38 0.30 0.37  CREATININE 0.81  --   --  0.81  --  0.77  TROPONINIHS 6 6  --   --   --   --    < > = values in this interval not displayed.     Estimated Creatinine Clearance: 89.1 mL/min (by C-G formula based on SCr of 0.77 mg/dL).   Medications:  Infusions:   sodium chloride     sodium chloride 1 mL/kg/hr (05/13/21 0600)   heparin 1,100 Units/hr (05/13/21 0600)   nitroGLYCERIN 15 mcg/min (05/13/21 0600)    Assessment: 65 yo M presenting with chest pain since last night.  Recently saw cardiology on 11/29 for similar concerns with recommendations for cardiac cath. Initiation of heparin drip for ACS/STEMI. No anticoagulation PTA noted  Heparin level therapeutic, CBC stable, cath today.  Goal of Therapy:  Heparin level 0.3-0.7 units/ml Monitor platelets by anticoagulation protocol: Yes   Plan:  Continue heparin 1100 units/h Daily heparin level and CBC  Arrie Senate, PharmD, [place], [facility] Clinical Pharmacist [phone] Please check AMION for all [place] numbers 05/13/2021

## 2021-05-13 NOTE — Interval H&P Note (Signed)
History and Physical Interval Note:  05/13/2021 12:32 PM  Jordan Lambert  has presented today for surgery, with the diagnosis of unstable angina.  The various methods of treatment have been discussed with the patient and family. After consideration of risks, benefits and other options for treatment, the patient has consented to  Procedure(s): LEFT HEART CATH AND CORONARY ANGIOGRAPHY (N/A) as a surgical intervention.  The patient's history has been reviewed, patient examined, no change in status, stable for surgery.  I have reviewed the patient's chart and labs.  Questions were answered to the patient's satisfaction.     Cath Lab Visit (complete for each Cath Lab visit)  Clinical Evaluation Leading to the Procedure:   ACS: Yes.    Non-ACS:  N/A  Ryelan Kazee

## 2021-05-13 NOTE — H&P (View-Only) (Signed)
Progress Note  Patient Name: Jordan Lambert Date of Encounter: 05/13/2021  [facility] Cardiologist: Carlyle Dolly, MD   Subjective   Still has mild 1-2/10 chest discomfort. Unable to titrate nitro drip due to soft blood pressure.   Inpatient Medications    Scheduled Meds:  amLODipine  5 mg Oral Daily   aspirin EC  81 mg Oral Daily   lisinopril  10 mg Oral Daily   rosuvastatin  40 mg Oral Daily   sodium chloride flush  3 mL Intravenous Q12H   traZODone  100 mg Oral QHS   Continuous Infusions:  sodium chloride     sodium chloride 1 mL/kg/hr (05/13/21 0600)   heparin 1,100 Units/hr (05/13/21 0600)   nitroGLYCERIN 20 mcg/min (05/13/21 0823)   PRN Meds: sodium chloride, acetaminophen, acetaminophen, HYDROmorphone, nitroGLYCERIN, ondansetron (ZOFRAN) IV, sodium chloride flush   Vital Signs    Vitals:   05/12/21 1456 05/13/21 0046 05/13/21 0351 05/13/21 0608  BP: 113/66 116/70 136/81   Pulse: 71 (!) 52 69 79  Resp: 17 12 13  (!) 21  Temp: 98 F (36.7 C)  97.8 F (36.6 C)   TempSrc: Oral  Oral   SpO2:   97% 98%  Weight:    70.9 kg  Height:        Intake/Output Summary (Last 24 hours) at 05/13/2021 0825 Last data filed at 05/13/2021 0600 Gross per 24 hour  Intake 1589.21 ml  Output 2375 ml  Net -785.79 ml   Last 3 Weights 05/13/2021 05/11/2021 05/11/2021  Weight (lbs) 156 lb 4.8 oz 157 lb 11.2 oz 162 lb  Weight (kg) 70.897 kg 71.532 kg 73.483 kg      Telemetry    Sinus bradycardia/rhythm  - Personally Reviewed  ECG    Sinus rhythm, PVCs - Personally Reviewed  Physical Exam   GEN: No acute distress.   Neck: No JVD Cardiac: RRR, no murmurs, rubs, or gallops.  Respiratory: Clear to auscultation bilaterally. GI: Soft, nontender, non-distended  MS: No edema; No deformity. Neuro:  Nonfocal  Psych: Normal affect   Labs    High Sensitivity Troponin:   Recent Labs  Lab 05/11/21 0703 05/11/21 0910  TROPONINIHS 6 6     Chemistry Recent Labs  Lab  05/11/21 0703 05/12/21 0322 05/13/21 0200  NA 137 137 137  K 3.9 3.4* 4.1  CL 105 104 107  CO2 24 23 23   GLUCOSE 151* 109* 119*  BUN 7* 9 10  CREATININE 0.81 0.81 0.77  CALCIUM 9.4 9.2 9.2  MG  --  2.0 2.0  PROT  --   --  6.2*  ALBUMIN  --   --  3.7  AST  --   --  20  ALT  --   --  30  ALKPHOS  --   --  71  BILITOT  --   --  0.9  GFRNONAA >60 >60 >60  ANIONGAP 8 10 7     Lipids No results for input(s): CHOL, TRIG, HDL, LABVLDL, LDLCALC, CHOLHDL in the last 168 hours.  Hematology Recent Labs  Lab 05/11/21 0703 05/12/21 0322 05/13/21 0200  WBC 10.2 9.8 10.8*  RBC 4.73 4.71 4.65  HGB 14.6 14.7 14.4  HCT 43.4 42.7 42.9  MCV 91.8 90.7 92.3  MCH 30.9 31.2 31.0  MCHC 33.6 34.4 33.6  RDW 12.2 12.1 12.6  PLT 235 204 212    Radiology    CT ANGIO CHEST AORTA W/CM & OR WO/CM  Result Date: 05/11/2021  CLINICAL DATA:  Chest and back pain, initial encounter EXAM: CT ANGIOGRAPHY CHEST WITH CONTRAST TECHNIQUE: Multidetector CT imaging of the chest was performed using the standard protocol during bolus administration of intravenous contrast. Multiplanar CT image reconstructions and MIPs were obtained to evaluate the vascular anatomy. CONTRAST:  123mL OMNIPAQUE IOHEXOL 350 MG/ML SOLN COMPARISON:  Plain film from earlier in the same day. FINDINGS: Cardiovascular: Thoracic aorta demonstrates atherosclerotic calcification without aneurysmal dilatation or dissection. No cardiac enlargement is seen. Pulmonary artery is well visualized without evidence of pulmonary embolism. Scattered coronary calcifications are noted. Mediastinum/Nodes: Thoracic inlet is within normal limits. No sizable hilar or mediastinal adenopathy is noted. The esophagus as visualized is within normal limits. Lungs/Pleura: Lungs are well aerated bilaterally. Diffuse emphysematous changes are seen. Mild dependent atelectatic changes are noted. No focal infiltrate or effusion is noted. No parenchymal nodules are seen. Upper  Abdomen: Right renal cyst is noted. No other focal abnormality in the upper abdomen is seen. Musculoskeletal: Degenerative changes of the thoracic spine are noted. No rib abnormality is noted. Changes of prior vertebral augmentation and spinal stimulator placement are noted. Review of the MIP images confirms the above findings. IMPRESSION: No evidence of aortic dissection. No pulmonary embolism is seen. Aortic Atherosclerosis (ICD10-I70.0) and Emphysema (ICD10-J43.9). Electronically Signed   By: Inez Catalina M.D.   On: 05/11/2021 21:41   ECHOCARDIOGRAM COMPLETE  Result Date: 05/12/2021    ECHOCARDIOGRAM REPORT   Patient Name:   Jordan Lambert Date of Exam: 05/12/2021 Medical Rec #:  [id number]     Height:       68.0 in Accession #:    [id number]    Weight:       157.7 lb Date of Birth:  July 24, 1955     BSA:          1.847 m Patient Age:    65 years      BP:           171/94 mmHg Patient Gender: M             HR:           81 bpm. Exam Location:  Inpatient Procedure: 2D Echo, Cardiac Doppler and Color Doppler Indications:    chest pain  History:        Patient has no prior history of Echocardiogram examinations.                 Risk Factors:Hypertension and Dyslipidemia.  Sonographer:    Melissa Morford RDCS (AE, PE) Referring Phys: [id number] [place]  1. Mild hypokinesis of the base/mid inferior/inferoseptal walls. . Left ventricular ejection fraction, by estimation, is 50 to 55%. The left ventricle has low normal function.  2. Right ventricular systolic function is normal. The right ventricular size is normal.  3. Trivial mitral valve regurgitation.  4. The aortic valve is tricuspid. Aortic valve regurgitation is not visualized. Aortic valve sclerosis is present, with no evidence of aortic valve stenosis. FINDINGS  Left Ventricle: Mild hypokinesis of the base/mid inferior/inferoseptal walls. Left ventricular ejection fraction, by estimation, is 50 to 55%. The left ventricle has low normal  function. The left ventricular internal cavity size was normal in size. There is no left ventricular hypertrophy. Right Ventricle: The right ventricular size is normal. Right vetricular wall thickness was not assessed. Right ventricular systolic function is normal. Left Atrium: Left atrial size was normal in size. Right Atrium: Right atrial size was normal in size. Pericardium: There is no  evidence of pericardial effusion. Mitral Valve: There is mild thickening of the mitral valve leaflet(s). Trivial mitral valve regurgitation. Tricuspid Valve: The tricuspid valve is normal in structure. Tricuspid valve regurgitation is trivial. Aortic Valve: The aortic valve is tricuspid. Aortic valve regurgitation is not visualized. Aortic valve sclerosis is present, with no evidence of aortic valve stenosis. Pulmonic Valve: The pulmonic valve was normal in structure. Pulmonic valve regurgitation is trivial. Aorta: The aortic root is normal in size and structure. IAS/Shunts: No atrial level shunt detected by color flow Doppler.  LEFT VENTRICLE PLAX 2D LVIDd:         4.90 cm     Diastology LVIDs:         3.40 cm     LV e' medial:    5.66 cm/s LV PW:         0.90 cm     LV E/e' medial:  13.6 LV IVS:        0.90 cm     LV e' lateral:   8.05 cm/s LVOT diam:     2.20 cm     LV E/e' lateral: 9.6 LV SV:         70 LV SV Index:   38 LVOT Area:     3.80 cm  LV Volumes (MOD) LV vol d, MOD A2C: 74.5 ml LV vol d, MOD A4C: 56.8 ml LV vol s, MOD A2C: 37.5 ml LV vol s, MOD A4C: 36.2 ml LV SV MOD A2C:     37.0 ml LV SV MOD A4C:     56.8 ml LV SV MOD BP:      29.6 ml RIGHT VENTRICLE TAPSE (M-mode): 2.4 cm LEFT ATRIUM             Index        RIGHT ATRIUM           Index LA diam:        4.30 cm 2.33 cm/m   RA Area:     14.40 cm LA Vol (A2C):   37.0 ml 20.03 ml/m  RA Volume:   34.10 ml  18.46 ml/m LA Vol (A4C):   59.5 ml 32.21 ml/m LA Biplane Vol: 47.5 ml 25.71 ml/m  AORTIC VALVE LVOT Vmax:   95.80 cm/s LVOT Vmean:  63.300 cm/s LVOT VTI:     0.185 m  AORTA Ao Root diam: 3.00 cm Ao Asc diam:  2.90 cm MITRAL VALVE MV Area (PHT): 3.50 cm    SHUNTS MV Decel Time: 217 msec    Systemic VTI:  0.18 m MV E velocity: 77.10 cm/s  Systemic Diam: 2.20 cm MV A velocity: 93.40 cm/s MV E/A ratio:  0.83 Dorris Carnes MD Electronically signed by Dorris Carnes MD Signature Date/Time: 05/12/2021/1:36:06 PM    Final     Cardiac Studies   Echo 05/12/21 1. Mild hypokinesis of the base/mid inferior/inferoseptal walls. . Left  ventricular ejection fraction, by estimation, is 50 to 55%. The left  ventricle has low normal function.   2. Right ventricular systolic function is normal. The right ventricular  size is normal.   3. Trivial mitral valve regurgitation.   4. The aortic valve is tricuspid. Aortic valve regurgitation is not  visualized. Aortic valve sclerosis is present, with no evidence of aortic  valve stenosis.   CT coronary 04/22/21 FINDINGS: CT-FFR analysis was performed on the original cardiac CT angiogram dataset. Diagrammatic representation of the CT-FFR analysis is provided in a separate PDF document in PACS. This  dictation was created using the PDF document and an interactive 3D model of the results. 3D model is not available in the EMR/PACS. Normal FFR range is >0.80.   1. Left Main: No significant functional stenosis.   2. LAD: significant functional stenosis, CT-FFR 0.83-> 0.77 at mLAD lesion. Ostial and Proximal LAD lesions do not show significant functional stenosis. 3. LCX: No significant functional stenosis, at [facility], CT-FFR 0.89-> 0.85. 4. RCA: Modeled as CTO after mRCA lesion.   IMPRESSION: 1. CT FFR analysis shows evidence of significant functional stenosis.  Patient Profile     65 y.o. male with HTN, tobacco use and known CAD where CTA demonstrated mid LAD lesion with positive FFR and RCA CTO who presented with chest tightness concerning for UA.  Assessment & Plan    Unstable angina/CAD - Outpatient abnormal coronary  CTA > for cath on 12/7 however presented with chest pain. CTA angio of chest negative for aortic dissection.  - Has mild chest pain - For cath later today  - Echo showed LVEF of 50-55% and mild hypokinesis of the base/mid inferior/inferoseptal walls. (Per Dr. Harl Bowie note "03/2021 echo pcp: LVEF 70%, grade I dd") - HR 50-70s. Add BB iF HR stable post cath  - Continue heparin and nitro drop (as blood pressure allows) - Continue ASA and statin   2. HTN - No results found for requested labs within last 8760 hours.  - Continue Crestor 40  3. HTN - Continue Amlodipine and Lisinopril  4. Tobacco smoking - Cessation advised    For questions or updates, please contact [facility] Please consult [email or web address] for contact info under        SignedLeanor Kail, PA  05/13/2021, 8:25 AM      Patient seen and examined. Agree with assessment and plan.  Significantly elevated coronary calcium score at 1313 with multivessel coronary calcification.  Echo revealed low normal LV function with mild hypokinesis of the basal to mid inferior and inferoseptal walls.  Presently patient has residual 1-2/10 chest pain.  Blood pressure has elevated to 170/107.  Nitroglycerin drip has been titrated to 30 mcg.  Reviewed the patient's coronary CT data with he and his wife.  Anticipate finding vessel disease total stenosis and FFR involving the LAD and RCA. I have reviewed the risks, indications, and alternatives to cardiac catheterization, possible angioplasty, and stenting with the patient. Risks include but are not limited to bleeding, infection, vascular injury, stroke, myocardial infection, arrhythmia, kidney injury, radiation-related injury in the case of prolonged fluoroscopy use, emergency cardiac surgery, and death. The patient understands the risks of serious complication is 1-2 in 8768 with diagnostic cardiac cath and 1-2% or less with angioplasty/stenting.  I discussed potential need for coronary  intervention with stenting, possible need for atherectomy, or possible CABG revascularization depending upon anatomical findings.  I discussed the importance of complete smoking cessation in this patient who has been smoking for approximately 50 years.   Troy Sine, MD, [facility] 05/13/2021 9:43 AM

## 2021-05-13 NOTE — Discharge Instructions (Signed)
Heart Healthy, Consistent Carbohydrate Nutrition Therapy   A heart-healthy and consistent carbohydrate diet is recommended to manage heart disease and diabetes. To follow a heart-healthy and consistent carbohydrate diet, Eat a balanced diet with whole grains, fruits and vegetables, and lean protein sources.  Choose heart-healthy unsaturated fats. Limit saturated fats, trans fats, and cholesterol intake. Eat more plant-based or vegetarian meals using beans and soy foods for protein.  Eat whole, unprocessed foods to limit the amount of sodium (salt) you eat.  Choose a consistent amount of carbohydrate at each meal and snack. Limit refined carbohydrates especially sugar, sweets and sugar-sweetened beverages.  If you drink alcohol, do so in moderation: one serving per day (women) and two servings per day (men). o One serving is equivalent to 12 ounces beer, 5 ounces wine, or 1.5 ounces distilled spirits  Tips Tips for Choosing Heart-Healthy Fats Choose lean protein and low-fat dairy foods to reduce saturated fat intake. Saturated fat is usually found in animal-based protein and is associated with certain health risks. Saturated fat is the biggest contributor to raise low-density lipoprotein (LDL) cholesterol levels. Research shows that limiting saturated fat lowers unhealthy cholesterol levels. Eat no more than 7% of your total calories each day from saturated fat. Ask your RDN to help you determine how much saturated fat is right for you. There are many foods that do not contain large amounts of saturated fats. Swapping these foods to replace foods high in saturated fats will help you limit the saturated fat you eat and improve your cholesterol levels. You can also try eating more plant-based or vegetarian meals. Instead of. Try:  Whole milk, cheese, yogurt, and ice cream 1% or skim milk, low-fat cheese, non-fat yogurt, and low-fat ice cream  Fatty, marbled beef and pork Lean beef, pork, or venison   Poultry with skin Poultry without skin  Butter, stick margarine Reduced-fat, whipped, or liquid spreads  Coconut oil, palm oil Liquid vegetable oils: corn, canola, olive, soybean and safflower oils   Avoid foods that contain trans fats. Trans fats increase levels of LDL-cholesterol. Hydrogenated fat in processed foods is the main source of trans fats in foods.  Trans fats can be found in stick margarine, shortening, processed sweets, baked goods, some fried foods, and packaged foods made with hydrogenated oils. Avoid foods with "partially hydrogenated oil" on the ingredient list such as: cookies, pastries, baked goods, biscuits, crackers, microwave popcorn, and frozen dinners.  Choose foods with heart healthy fats. Polyunsaturated and monounsaturated fat are unsaturated fats that may help lower your blood cholesterol level when used in place of saturated fat in your diet. Ask your RDN about taking a dietary supplement with plant sterols and stanols to help lower your cholesterol level. Research shows that substituting saturated fats with unsaturated fats is beneficial to cholesterol levels. Try these easy swaps: Instead of. Try:  Butter, stick margarine, or solid shortening Reduced-fat, whipped, or liquid spreads  Beef, pork, or poultry with skin Fish and seafood  Chips, crackers, snack foods Raw or unsalted nuts and seeds or nut butters Hummus with vegetables Avocado on toast  Coconut oil, palm oil Liquid vegetable oils: corn, canola, olive, soybean and safflower oils   Limit the amount of cholesterol you eat to less than 200 milligrams per day. Cholesterol is a substance carried through the bloodstream via lipoproteins, which are known as "transporters" of fat. Some body functions need cholesterol to work properly, but too much cholesterol in the bloodstream can damage arteries and build up blood  vessel linings (which can lead to heart attack and stroke). You should eat less than 200  milligrams cholesterol per day. People respond differently to eating cholesterol. There is no test available right now that can figure out which people will respond more to dietary cholesterol and which will respond less. For individuals with high intake of dietary cholesterol, different types of increase (none, small, moderate, large) in LDL-cholesterol levels are all possible.  Food sources of cholesterol include egg yolks and organ meats such as liver, gizzards. Limit egg yolks to two to four per week and avoid organ meats like liver and gizzards to control cholesterol intake.  Tips for Choosing Heart-Healthy Carbohydrates Consume a consistent amount of carbohydrate It is important to eat foods with carbohydrates in moderation because they impact your blood glucose level. Carbohydrates can be found in many foods such as: Grains (breads, crackers, rice, pasta, and cereals)  Starchy Vegetables (potatoes, corn, and peas)  Beans and legumes  Milk, soy milk, and yogurt  Fruit and fruit juice  Sweets (cakes, cookies, ice cream, jam and jelly) Your RDN will help you set a goal for how many carbohydrate servings to eat at your meals and snacks. For many adults, eating 3 to 5 servings of carbohydrate foods at each meal and 1 or 2 carbohydrate servings for each snack works well.  Check your blood glucose level regularly. It can tell you if you need to adjust when you eat carbohydrates.  Choose foods rich in viscous (soluble) fiber Viscous, or soluble, is found in the walls of plant cells. Viscous fiber is found only in plant-based foods. Eating foods with fiber helps to lower your unhealthy cholesterol and keep your blood glucose in range  Rich sources of viscous fiber include vegetables (asparagus, Brussels sprouts, sweet potatoes, turnips) fruit (apricots, mangoes, oranges), legumes, and whole grains (barley, oats, and oat bran).  As you increase your fiber intake gradually, also increase the amount of  water you drink. This will help prevent constipation.  If you have difficulty achieving this goal, ask your RDN about fiber laxatives. Choose fiber supplements made with viscous fibers such as psyllium seed husks or methylcellulose to help lower unhealthy cholesterol.   Limit refined carbohydrates  There are three types of carbohydrates: starches, sugar, and fiber. Some carbohydrates occur naturally in food, like the starches in rice or corn or the sugars in fruits and milk. Refined carbohydrates--foods with high amounts of simple sugars--can raise triglyceride levels. High triglyceride levels are associated with coronary heart disease. Some examples of refined carbohydrate foods are table sugar, sweets, and beverages sweetened with added sugar.  Tips for Reducing Sodium (Salt) Although sodium is important for your body to function, too much sodium can be harmful for people with high blood pressure. As sodium and fluid buildup in your tissues and bloodstream, your blood pressure increases. High blood pressure may cause damage to other organs and increase your risk for a stroke. Even if you take a pill for blood pressure or a water pill (diuretic) to remove fluid, it is still important to have less salt in your diet. Ask your doctor and RDN what amount of sodium is right for you. Avoid processed foods. Eat more fresh foods.  Fresh fruits and vegetables are naturally low in sodium, as well as frozen vegetables and fruits that have no added juices or sauces.  Fresh meats are lower in sodium than processed meats, such as bacon, sausage, and hotdogs. Read the nutrition label or ask your  butcher to help you find a fresh meat that is low in sodium. Eat less salt--at the table and when cooking.  A single teaspoon of table salt has 2,300 mg of sodium.  Leave the salt out of recipes for pasta, casseroles, and soups.  Ask your RDN how to cook your favorite recipes without sodium Be a smart shopper.  Look for  food packages that say "salt-free" or "sodium-free." These items contain less than 5 milligrams of sodium per serving.  "Very low-sodium" products contain less than 35 milligrams of sodium per serving.  "Low-sodium" products contain less than 140 milligrams of sodium per serving.  Beware for "Unsalted" or "No Added Salt" products. These items may still be high in sodium. Check the nutrition label. Add flavors to your food without adding sodium.  Try lemon juice, lime juice, fruit juice or vinegar.  Dry or fresh herbs add flavor. Try basil, bay leaf, dill, rosemary, parsley, sage, dry mustard, nutmeg, thyme, and paprika.  Pepper, red pepper flakes, and cayenne pepper can add spice t your meals without adding sodium. Hot sauce contains sodium, but if you use just a drop or two, it will not add up to much.  Buy a sodium-free seasoning blend or make your own at home. Additional Lifestyle Tips Achieve and maintain a healthy weight. Talk with your RDN or your doctor about what is a healthy weight for you. Set goals to reach and maintain that weight.  To lose weight, reduce your calorie intake along with increasing your physical activity. A weight loss of 10 to 15 pounds could reduce LDL-cholesterol by 5 milligrams per deciliter. Participate in physical activity. Talk with your health care team to find out what types of physical activity are best for you. Set a plan to get about 30 minutes of exercise on most days.  Foods Recommended Food Group Foods Recommended  Grains Whole grain breads and cereals, including whole wheat, barley, rye, buckwheat, corn, teff, quinoa, millet, amaranth, brown or wild rice, sorghum, and oats Pasta, especially whole wheat or other whole grain types  [employer], quinoa or wild rice Whole grain crackers, bread, rolls, pitas Home-made bread with reduced-sodium baking soda  Protein Foods Lean cuts of beef and pork (loin, leg, round, extra lean hamburger)  Skinless  Cytogeneticist and other wild game Dried beans and peas Nuts and nut butters Meat alternatives made with soy or textured vegetable protein  Egg whites or egg substitute Cold cuts made with lean meat or soy protein  Dairy Nonfat (skim), low-fat, or 1%-fat milk  Nonfat or low-fat yogurt or cottage cheese Fat-free and low-fat cheese  Vegetables Fresh, frozen, or canned vegetables without added fat or salt   Fruits Fresh, frozen, canned, or dried fruit   Oils Unsaturated oils (corn, olive, peanut, soy, sunflower, canola)  Soft or liquid margarines and vegetable oil spreads  Salad dressings Seeds and nuts  Avocado   Foods Not Recommended Food Group Foods Not Recommended  Grains Breads or crackers topped with salt Cereals (hot or cold) with more than 300 mg sodium per serving Biscuits, cornbread, and other "quick" breads prepared with baking soda Bread crumbs or stuffing mix from a store High-fat bakery products, such as doughnuts, biscuits, croissants, danish pastries, pies, cookies Instant cooking foods to which you add hot water and stir--potatoes, noodles, rice, etc. Packaged starchy foods--seasoned noodle or rice dishes, stuffing mix, macaroni and cheese dinner Snacks made with partially hydrogenated oils, including chips, cheese puffs, snack mixes, regular  crackers, butter-flavored popcorn  Protein Foods Higher-fat cuts of meats (ribs, t-bone steak, regular hamburger) Bacon, sausage, or hot dogs Cold cuts, such as salami or bologna, deli meats, cured meats, corned beef Organ meats (liver, brains, gizzards, sweetbreads) Poultry with skin Fried or smoked meat, poultry, and fish Whole eggs and egg yolks (more than 2-4 per week) Salted legumes, nuts, seeds, or nut/seed butters Meat alternatives with high levels of sodium (>300 mg per serving) or saturated fat (>5 g per serving)  Dairy Whole milk,?2% fat milk, buttermilk Whole milk yogurt or ice  cream Cream Half-&-half Cream cheese Sour cream Cheese  Vegetables Canned or frozen vegetables with salt, fresh vegetables prepared with salt, butter, cheese, or cream sauce Fried vegetables Pickled vegetables such as olives, pickles, or sauerkraut  Fruits Fried fruits Fruits served with butter or cream  Oils Butter, stick margarine, shortening Partially hydrogenated oils or trans fats Tropical oils (coconut, palm, palm kernel oils)  Other Candy, sugar sweetened soft drinks and desserts Salt, sea salt, garlic salt, and seasoning mixes containing salt Bouillon cubes Ketchup, barbecue sauce, Worcestershire sauce, soy sauce, teriyaki sauce Miso Salsa Pickles, olives, relish   Copyright 2020  [employer]. All rights reserved.
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
## 2021-05-14 ENCOUNTER — Encounter (HOSPITAL_COMMUNITY): Payer: Self-pay | Admitting: Internal Medicine

## 2021-05-14 ENCOUNTER — Other Ambulatory Visit (HOSPITAL_COMMUNITY): Payer: Self-pay

## 2021-05-14 ENCOUNTER — Other Ambulatory Visit: Payer: Self-pay

## 2021-05-14 DIAGNOSIS — E440 Moderate protein-calorie malnutrition: Secondary | ICD-10-CM | POA: Insufficient documentation

## 2021-05-14 DIAGNOSIS — I209 Angina pectoris, unspecified: Secondary | ICD-10-CM

## 2021-05-14 LAB — COMPREHENSIVE METABOLIC PANEL
ALT: 32 U/L (ref 0–44)
AST: 26 U/L (ref 15–41)
Albumin: 3.4 g/dL — ABNORMAL LOW (ref 3.5–5.0)
Alkaline Phosphatase: 65 U/L (ref 38–126)
Anion gap: 9 (ref 5–15)
BUN: 9 mg/dL (ref 8–23)
CO2: 23 mmol/L (ref 22–32)
Calcium: 9 mg/dL (ref 8.9–10.3)
Chloride: 105 mmol/L (ref 98–111)
Creatinine, Ser: 0.83 mg/dL (ref 0.61–1.24)
GFR, Estimated: >60 mL/min (ref >60)
Glucose, Bld: 103 mg/dL — ABNORMAL HIGH (ref 70–99)
Potassium: 3.5 mmol/L (ref 3.5–5.1)
Sodium: 137 mmol/L (ref 135–145)
Total Bilirubin: 1.2 mg/dL (ref 0.3–1.2)
Total Protein: 6.1 g/dL — ABNORMAL LOW (ref 6.5–8.1)

## 2021-05-14 LAB — CBC WITH DIFFERENTIAL/PLATELET
Abs Immature Granulocytes: 0.05 10*3/uL (ref 0.00–0.07)
Basophils Absolute: 0.1 10*3/uL (ref 0.0–0.1)
Basophils Relative: 1 %
Eosinophils Absolute: 0.3 10*3/uL (ref 0.0–0.5)
Eosinophils Relative: 3 %
HCT: 39.9 % (ref 39.0–52.0)
Hemoglobin: 13.5 g/dL (ref 13.0–17.0)
Immature Granulocytes: 1 %
Lymphocytes Relative: 23 %
Lymphs Abs: 2.4 10*3/uL (ref 0.7–4.0)
MCH: 31 pg (ref 26.0–34.0)
MCHC: 33.8 g/dL (ref 30.0–36.0)
MCV: 91.7 fL (ref 80.0–100.0)
Monocytes Absolute: 1 10*3/uL (ref 0.1–1.0)
Monocytes Relative: 9 %
Neutro Abs: 7 10*3/uL (ref 1.7–7.7)
Neutrophils Relative %: 63 %
Platelets: 207 10*3/uL (ref 150–400)
RBC: 4.35 MIL/uL (ref 4.22–5.81)
RDW: 12.3 % (ref 11.5–15.5)
WBC: 10.8 10*3/uL — ABNORMAL HIGH (ref 4.0–10.5)
nRBC: 0 % (ref 0.0–0.2)

## 2021-05-14 LAB — LIPID PANEL
Cholesterol: 115 mg/dL (ref 0–200)
HDL: 39 mg/dL — ABNORMAL LOW (ref >40)
LDL Cholesterol: 64 mg/dL (ref 0–99)
Total CHOL/HDL Ratio: 2.9 RATIO
Triglycerides: 60 mg/dL (ref <150)
VLDL: 12 mg/dL (ref 0–40)

## 2021-05-14 LAB — PHOSPHORUS: Phosphorus: 4.5 mg/dL (ref 2.5–4.6)

## 2021-05-14 LAB — MAGNESIUM: Magnesium: 1.8 mg/dL (ref 1.7–2.4)

## 2021-05-14 MED ORDER — ATORVASTATIN CALCIUM 80 MG PO TABS: 80.0000 mg | ORAL_TABLET | Freq: Every day | ORAL | Status: DC

## 2021-05-14 MED ORDER — CERTAVITE/ANTIOXIDANTS PO TABS
1.0000 | ORAL_TABLET | Freq: Every day | ORAL | 0 refills | Status: AC
  Filled 2021-05-14: qty 30, 30d supply, fill #0

## 2021-05-14 MED ORDER — TICAGRELOR 90 MG PO TABS
90.0000 mg | ORAL_TABLET | Freq: Two times a day (BID) | ORAL | 11 refills | Status: DC
  Filled 2021-05-14: qty 60, 30d supply, fill #0

## 2021-05-14 MED ORDER — NITROGLYCERIN 0.4 MG SL SUBL
0.4000 mg | SUBLINGUAL_TABLET | SUBLINGUAL | 1 refills | Status: DC | PRN
  Filled 2021-05-14: qty 25, 7d supply, fill #0

## 2021-05-14 MED ORDER — METOPROLOL TARTRATE 25 MG PO TABS
12.5000 mg | ORAL_TABLET | Freq: Two times a day (BID) | ORAL | 11 refills | Status: DC
  Filled 2021-05-14: qty 30, 30d supply, fill #0

## 2021-05-14 MED ORDER — AMLODIPINE BESYLATE 5 MG PO TABS
5.0000 mg | ORAL_TABLET | Freq: Every day | ORAL | 11 refills | Status: DC
Start: 2021-05-14 — End: 2023-04-10
  Filled 2021-05-14: qty 30, 30d supply, fill #0

## 2021-05-14 MED ORDER — METOPROLOL TARTRATE 12.5 MG HALF TABLET
12.5000 mg | ORAL_TABLET | Freq: Two times a day (BID) | ORAL | Status: DC
  Administered 2021-05-14: 12.5 mg via ORAL
  Filled 2021-05-14: qty 1

## 2021-05-14 MED ORDER — ISOSORBIDE MONONITRATE ER 30 MG PO TB24
30.0000 mg | ORAL_TABLET | Freq: Every day | ORAL | 11 refills | Status: DC
  Filled 2021-05-14: qty 30, 30d supply, fill #0

## 2021-05-14 MED ORDER — MAGNESIUM SULFATE 2 GM/50ML IV SOLN
2.0000 g | Freq: Once | INTRAVENOUS | Status: AC
  Administered 2021-05-14: 2 g via INTRAVENOUS
  Filled 2021-05-14: qty 50

## 2021-05-14 NOTE — Progress Notes (Addendum)
Progress Note  Patient Name: Jordan Lambert Date of Encounter: 05/14/2021  [facility] Cardiologist: Carlyle Dolly, MD   Subjective   Feeling well. No chest pain, sob or palpitations.    Inpatient Medications    Scheduled Meds:  amLODipine  5 mg Oral Daily   aspirin EC  81 mg Oral Daily   enoxaparin (LOVENOX) injection  40 mg Subcutaneous Q24H   feeding supplement  237 mL Oral BID BM   isosorbide mononitrate  30 mg Oral Daily   lisinopril  10 mg Oral Daily   metoprolol tartrate  12.5 mg Oral BID   multivitamin with minerals  1 tablet Oral Daily   rosuvastatin  40 mg Oral Daily   sodium chloride flush  3 mL Intravenous Q12H   ticagrelor  90 mg Oral BID   traZODone  100 mg Oral QHS   Continuous Infusions:  sodium chloride     nitroGLYCERIN Stopped (05/13/21 1713)   PRN Meds: sodium chloride, acetaminophen, acetaminophen, HYDROmorphone, nitroGLYCERIN, ondansetron (ZOFRAN) IV, sodium chloride flush   Vital Signs    Vitals:   05/13/21 1433 05/13/21 1536 05/13/21 2124 05/14/21 0551  BP: 126/77 (!) 144/76 (!) 147/68 (!) 152/77  Pulse: 63  83 63  Resp: 16  17 18   Temp: 98 F (36.7 C) 97.8 F (36.6 C) 98.5 F (36.9 C) 97.8 F (36.6 C)  TempSrc: Oral Oral Oral Oral  SpO2: 93%  95% 99%  Weight:      Height:        Intake/Output Summary (Last 24 hours) at 05/14/2021 0802 Last data filed at 05/14/2021 0540 Gross per 24 hour  Intake 1316.36 ml  Output 1375 ml  Net -58.64 ml   Last 3 Weights 05/13/2021 05/11/2021 05/11/2021  Weight (lbs) 156 lb 4.8 oz 157 lb 11.2 oz 162 lb  Weight (kg) 70.897 kg 71.532 kg 73.483 kg      Telemetry    Sinus rhythm HR 60-70s, intermittent tachycardia at 110s - Personally Reviewed  ECG    NSR - Personally Reviewed  Physical Exam   GEN: No acute distress.   Neck: No JVD Cardiac: RRR, no murmurs, rubs, or gallops. Right radial cath site without hematoma  Respiratory: Clear to auscultation bilaterally. GI: Soft, nontender,  non-distended  MS: No edema; No deformity. Neuro:  Nonfocal  Psych: Normal affect   Labs    High Sensitivity Troponin:   Recent Labs  Lab 05/11/21 0703 05/11/21 0910  TROPONINIHS 6 6     Chemistry Recent Labs  Lab 05/12/21 0322 05/13/21 0200 05/14/21 0148  NA 137 137 137  K 3.4* 4.1 3.5  CL 104 107 105  CO2 23 23 23   GLUCOSE 109* 119* 103*  BUN 9 10 9   CREATININE 0.81 0.77 0.83  CALCIUM 9.2 9.2 9.0  MG 2.0 2.0 1.8  PROT  --  6.2* 6.1*  ALBUMIN  --  3.7 3.4*  AST  --  20 26  ALT  --  30 32  ALKPHOS  --  71 65  BILITOT  --  0.9 1.2  GFRNONAA >60 >60 >60  ANIONGAP 10 7 9     Lipids  Recent Labs  Lab 05/14/21 0148  CHOL 115  TRIG 60  HDL 39*  LDLCALC 64  CHOLHDL 2.9    Hematology Recent Labs  Lab 05/12/21 0322 05/13/21 0200 05/14/21 0148  WBC 9.8 10.8* 10.8*  RBC 4.71 4.65 4.35  HGB 14.7 14.4 13.5  HCT 42.7 42.9 39.9  MCV 90.7 92.3 91.7  MCH 31.2 31.0 31.0  MCHC 34.4 33.6 33.8  RDW 12.1 12.6 12.3  PLT 204 212 207    Radiology    CARDIAC CATHETERIZATION  Result Date: 05/13/2021 Conclusions: Severe two-vessel coronary artery disease with sequential ostial through distal LAD stenoses of up to 70% shown to be hemodynamically significant on recent CT-FFR as well as 99% stenosis of tiny D1 branch and chronic total occlusion of the mid RCA with left-right collaterals.  Moderate, nonobstructive CAD noted in the mid LCx and OM1. Normal left ventricular filling pressure (LVEDP 10 mmHg). Successful PCI to proximal and mid LAD using nonoverlapping Onyx Frontier 3.0 x 12 mm (proximal) and 2.75 x 15 mm (mid) drug-eluting stents with 0% residual stenosis and TIMI-3 flow. Recommendations: Dual antiplatelet therapy with aspirin and ticagrelor for at least 12 months. Aggressive secondary prevention of coronary artery disease. Add isosorbide mononitrate for medical therapy of D1 and RCA disease.  Wean nitroglycerin infusion as tolerated. Nelva Bush, MD [facility]  [facility]  ECHOCARDIOGRAM COMPLETE  Result Date: 05/12/2021    ECHOCARDIOGRAM REPORT   Patient Name:   Jordan Lambert Date of Exam: 05/12/2021 Medical Rec #:  [id number]     Height:       68.0 in Accession #:    [id number]    Weight:       157.7 lb Date of Birth:  1955-12-16     BSA:          1.847 m Patient Age:    65 years      BP:           171/94 mmHg Patient Gender: M             HR:           81 bpm. Exam Location:  Inpatient Procedure: 2D Echo, Cardiac Doppler and Color Doppler Indications:    chest pain  History:        Patient has no prior history of Echocardiogram examinations.                 Risk Factors:Hypertension and Dyslipidemia.  Sonographer:    Melissa Morford RDCS (AE, PE) Referring Phys: [id number] [place]  1. Mild hypokinesis of the base/mid inferior/inferoseptal walls. . Left ventricular ejection fraction, by estimation, is 50 to 55%. The left ventricle has low normal function.  2. Right ventricular systolic function is normal. The right ventricular size is normal.  3. Trivial mitral valve regurgitation.  4. The aortic valve is tricuspid. Aortic valve regurgitation is not visualized. Aortic valve sclerosis is present, with no evidence of aortic valve stenosis. FINDINGS  Left Ventricle: Mild hypokinesis of the base/mid inferior/inferoseptal walls. Left ventricular ejection fraction, by estimation, is 50 to 55%. The left ventricle has low normal function. The left ventricular internal cavity size was normal in size. There is no left ventricular hypertrophy. Right Ventricle: The right ventricular size is normal. Right vetricular wall thickness was not assessed. Right ventricular systolic function is normal. Left Atrium: Left atrial size was normal in size. Right Atrium: Right atrial size was normal in size. Pericardium: There is no evidence of pericardial effusion. Mitral Valve: There is mild thickening of the mitral valve leaflet(s). Trivial mitral valve regurgitation.  Tricuspid Valve: The tricuspid valve is normal in structure. Tricuspid valve regurgitation is trivial. Aortic Valve: The aortic valve is tricuspid. Aortic valve regurgitation is not visualized. Aortic valve sclerosis is present, with no evidence of aortic valve stenosis.  Pulmonic Valve: The pulmonic valve was normal in structure. Pulmonic valve regurgitation is trivial. Aorta: The aortic root is normal in size and structure. IAS/Shunts: No atrial level shunt detected by color flow Doppler.  LEFT VENTRICLE PLAX 2D LVIDd:         4.90 cm     Diastology LVIDs:         3.40 cm     LV e' medial:    5.66 cm/s LV PW:         0.90 cm     LV E/e' medial:  13.6 LV IVS:        0.90 cm     LV e' lateral:   8.05 cm/s LVOT diam:     2.20 cm     LV E/e' lateral: 9.6 LV SV:         70 LV SV Index:   38 LVOT Area:     3.80 cm  LV Volumes (MOD) LV vol d, MOD A2C: 74.5 ml LV vol d, MOD A4C: 56.8 ml LV vol s, MOD A2C: 37.5 ml LV vol s, MOD A4C: 36.2 ml LV SV MOD A2C:     37.0 ml LV SV MOD A4C:     56.8 ml LV SV MOD BP:      29.6 ml RIGHT VENTRICLE TAPSE (M-mode): 2.4 cm LEFT ATRIUM             Index        RIGHT ATRIUM           Index LA diam:        4.30 cm 2.33 cm/m   RA Area:     14.40 cm LA Vol (A2C):   37.0 ml 20.03 ml/m  RA Volume:   34.10 ml  18.46 ml/m LA Vol (A4C):   59.5 ml 32.21 ml/m LA Biplane Vol: 47.5 ml 25.71 ml/m  AORTIC VALVE LVOT Vmax:   95.80 cm/s LVOT Vmean:  63.300 cm/s LVOT VTI:    0.185 m  AORTA Ao Root diam: 3.00 cm Ao Asc diam:  2.90 cm MITRAL VALVE MV Area (PHT): 3.50 cm    SHUNTS MV Decel Time: 217 msec    Systemic VTI:  0.18 m MV E velocity: 77.10 cm/s  Systemic Diam: 2.20 cm MV A velocity: 93.40 cm/s MV E/A ratio:  0.83 Dorris Carnes MD Electronically signed by Dorris Carnes MD Signature Date/Time: 05/12/2021/1:36:06 PM    Final     Cardiac Studies   CORONARY STENT INTERVENTION  LEFT HEART CATH AND CORONARY ANGIOGRAPHY   Conclusion  Conclusions: Severe two-vessel coronary artery disease with  sequential ostial through distal LAD stenoses of up to 70% shown to be hemodynamically significant on recent CT-FFR as well as 99% stenosis of tiny D1 branch and chronic total occlusion of the mid RCA with left-right collaterals.  Moderate, nonobstructive CAD noted in the mid LCx and OM1. Normal left ventricular filling pressure (LVEDP 10 mmHg). Successful PCI to proximal and mid LAD using nonoverlapping Onyx Frontier 3.0 x 12 mm (proximal) and 2.75 x 15 mm (mid) drug-eluting stents with 0% residual stenosis and TIMI-3 flow.   Recommendations: Dual antiplatelet therapy with aspirin and ticagrelor for at least 12 months. Aggressive secondary prevention of coronary artery disease. Add isosorbide mononitrate for medical therapy of D1 and RCA disease.  Wean nitroglycerin infusion as tolerated.   Diagnostic Dominance: Right Intervention   Echo 05/12/21  1. Mild hypokinesis of the base/mid inferior/inferoseptal walls. . Left  ventricular ejection  fraction, by estimation, is 50 to 55%. The left  ventricle has low normal function.   2. Right ventricular systolic function is normal. The right ventricular  size is normal.   3. Trivial mitral valve regurgitation.   4. The aortic valve is tricuspid. Aortic valve regurgitation is not  visualized. Aortic valve sclerosis is present, with no evidence of aortic  valve stenosis.    Patient Profile     65 y.o. male with HTN, tobacco use and known CAD where CTA demonstrated mid LAD lesion with positive FFR and RCA CTO who presented with chest tightness concerning for UA.    Assessment & Plan    Unstable angina/CAD - Outpatient abnormal coronary CTA > for cath on 12/7 however presented with chest pain. CTA angio of chest negative for aortic dissection.  - Echo showed LVEF of 50-55% and mild hypokinesis of the base/mid inferior/inferoseptal walls. (Per Dr. Harl Bowie note "03/2021 echo pcp: LVEF 70%, grade I dd") - treated with heparin and nitro drop -  Cath showed severe two-vessel coronary artery disease with sequential ostial through distal LAD stenoses of up to 70% shown to be hemodynamically significant on recent CT-FFR as well as 99% stenosis of tiny D1 branch and chronic total occlusion of the mid RCA with left-right collaterals.  Moderate, nonobstructive CAD noted in the mid LCx and OM1. - S/p Successful PCI to proximal and mid LAD using nonoverlapping Onyx Frontier 3.0 x 12 mm (proximal) and 2.75 x 15 mm (mid) drug-eluting stents. - Plan for DAPT with ASA and Brilinta for 12 months. Cost could be issue. Please review at follow up. May need to change to Plavix.  - Continue statin, Imdur and BB   2. HTN - 05/14/2021: Cholesterol 115; HDL 39; LDL Cholesterol 64; Triglycerides 60; VLDL 12  - Continue statin (Lipitor increased to 80mg  qd)  3. HTN - Continue Amlodipine and Lisinopril - ADD BB   4. Tobacco smoking - Cessation advised   5. Intermittent tachycardia - Telemetry with HR in 60-70s mostly but intermittently HR in 110-120s. Add low dose metoprolol 12.5mg  BID.    Ambulate with cardiac rehb. discharge later today once seen by Dr. Claiborne Billings. Will arrange follow up.   For questions or updates, please contact [place] Please consult [email or web address] for contact info under        SignedLeanor Kail, PA  05/14/2021, 8:02 AM      Patient seen and examined. Agree with assessment and plan.  Patient feels improved, no recurrent chest pain following successful stenting of his proximal and mid LAD stenoses which were FFR positive.  There is total occlusion of mid RCA with excellent left-to-right collateralization.  I discussed the importance of complete smoking cessation.  The patient has been smoking for 50 years.  Heart rate this morning is in the 80s to 90s.  Beta-blocker has been initiated at low-dose but this may need to be further titrated as outpatient.  Right radial site stable.  Okay for discharge today with follow-up  with Dr. Harl Bowie.   Troy Sine, MD, [facility] 05/14/2021 9:03 AM

## 2021-05-14 NOTE — Discharge Summary (Signed)
Discharge Summary    Patient ID: Jordan Lambert MRN: [id number]; DOB: 1956-02-22  Admit date: 05/11/2021 Discharge date: 05/14/2021  PCP:  Monico Blitz, MD   [facility] Providers Cardiologist:  Carlyle Dolly, MD   {  Discharge Diagnoses    Principal Problem:   Unstable angina [facility]) Active Problems:   Angina pectoris ([place])   Tobacco abuse   Hyperlipidemia with target LDL less than 70   Hypertension   Malnutrition of moderate degree  Diagnostic Studies/Procedures    CORONARY STENT INTERVENTION  LEFT HEART CATH AND CORONARY ANGIOGRAPHY    Conclusion   Conclusions: Severe two-vessel coronary artery disease with sequential ostial through distal LAD stenoses of up to 70% shown to be hemodynamically significant on recent CT-FFR as well as 99% stenosis of tiny D1 branch and chronic total occlusion of the mid RCA with left-right collaterals.  Moderate, nonobstructive CAD noted in the mid LCx and OM1. Normal left ventricular filling pressure (LVEDP 10 mmHg). Successful PCI to proximal and mid LAD using nonoverlapping Onyx Frontier 3.0 x 12 mm (proximal) and 2.75 x 15 mm (mid) drug-eluting stents with 0% residual stenosis and TIMI-3 flow.   Recommendations: Dual antiplatelet therapy with aspirin and ticagrelor for at least 12 months. Aggressive secondary prevention of coronary artery disease. Add isosorbide mononitrate for medical therapy of D1 and RCA disease.  Wean nitroglycerin infusion as tolerated.   Diagnostic Dominance: Right Intervention    Echo 05/12/21  1. Mild hypokinesis of the base/mid inferior/inferoseptal walls. . Left  ventricular ejection fraction, by estimation, is 50 to 55%. The left  ventricle has low normal function.   2. Right ventricular systolic function is normal. The right ventricular  size is normal.   3. Trivial mitral valve regurgitation.   4. The aortic valve is tricuspid. Aortic valve regurgitation is not  visualized. Aortic valve  sclerosis is present, with no evidence of aortic  valve stenosis.       History of Present Illness     65 y.o. male with HTN, tobacco use and known CAD where CTA demonstrated mid LAD lesion with positive FFR and RCA CTO who presented with chest tightness concerning for UA.  Coronary CTA on 04/22/21 with mid-LAD stenosis with FFR 0.83>0.77; RCA with CTO. TTE 2018 with LVEF 50-55%, no WMA. The patient was planned for cath with Dr. Irish Lack on Monday but presented to the ER with persistent substernal chest pain with concern for ACS for which Cardiology was called.  Hospital Course     Consultants: IM as attending    Unstable angina/CAD - Outpatient abnormal coronary CTA > for cath on 12/7 however presented with chest pain. CTA angio of chest negative for aortic dissection.  - Echo showed LVEF of 50-55% and mild hypokinesis of the base/mid inferior/inferoseptal walls. (Per Dr. Harl Bowie note "03/2021 echo pcp: LVEF 70%, grade I dd") - treated with heparin and nitro drop - Cath showed severe two-vessel coronary artery disease with sequential ostial through distal LAD stenoses of up to 70% shown to be hemodynamically significant on recent CT-FFR as well as 99% stenosis of tiny D1 branch and chronic total occlusion of the mid RCA with left-right collaterals.  Moderate, nonobstructive CAD noted in the mid LCx and OM1. - S/p Successful PCI to proximal and mid LAD using nonoverlapping Onyx Frontier 3.0 x 12 mm (proximal) and 2.75 x 15 mm (mid) drug-eluting stents. - Plan for DAPT with ASA and Brilinta for 12 months. Cost could be issue. Please review at  follow up. May need to change to Plavix.  - Continue statin, Imdur and BB - ambulated well. CRP II at [place].    2. HTN - 05/14/2021: Cholesterol 115; HDL 39; LDL Cholesterol 64; Triglycerides 60; VLDL 12  - Continue statin (Lipitor increased to 80mg  qd)   3. HTN - Continue Amlodipine and Lisinopril - ADD BB   4. Tobacco smoking - Cessation  advised    5. Intermittent tachycardia - Telemetry with HR in 60-70s mostly but intermittently HR in 110-120s. Add low dose metoprolol 12.5mg  BID.     Did the patient have an acute coronary syndrome (MI, NSTEMI, STEMI, etc) this admission?:  No                               Did the patient have a percutaneous coronary intervention (stent / angioplasty)?:  Yes.     Cath/PCI Registry Performance & Quality Measures: Aspirin prescribed? - Yes ADP Receptor Inhibitor (Plavix/Clopidogrel, Brilinta/Ticagrelor or Effient/Prasugrel) prescribed (includes medically managed patients)? - Yes High Intensity Statin (Lipitor 40-80mg  or Crestor 20-40mg ) prescribed? - Yes For EF <40%, was ACEI/ARB prescribed? - Yes For EF <40%, Aldosterone Antagonist (Spironolactone or Eplerenone) prescribed? - Not Applicable (EF >/= 36%) Cardiac Rehab Phase II ordered? - Yes         _____________  Discharge Vitals Blood pressure (!) 166/90, pulse 89, temperature 97.8 F (36.6 C), temperature source Oral, resp. rate 18, height 5\' 8"  (1.727 m), weight 70.9 kg, SpO2 99 %.  Filed Weights   05/11/21 0643 05/11/21 1649 05/13/21 0608  Weight: 73.5 kg 71.5 kg 70.9 kg    Labs & Radiologic Studies    CBC Recent Labs    05/13/21 0200 05/14/21 0148  WBC 10.8* 10.8*  NEUTROABS 6.4 7.0  HGB 14.4 13.5  HCT 42.9 39.9  MCV 92.3 91.7  PLT 212 644   Basic Metabolic Panel Recent Labs    05/13/21 0200 05/14/21 0148  NA 137 137  K 4.1 3.5  CL 107 105  CO2 23 23  GLUCOSE 119* 103*  BUN 10 9  CREATININE 0.77 0.83  CALCIUM 9.2 9.0  MG 2.0 1.8  PHOS 3.6 4.5   Liver Function Tests Recent Labs    05/13/21 0200 05/14/21 0148  AST 20 26  ALT 30 32  ALKPHOS 71 65  BILITOT 0.9 1.2  PROT 6.2* 6.1*  ALBUMIN 3.7 3.4*   No results for input(s): LIPASE, AMYLASE in the last 72 hours. High Sensitivity Troponin:   Recent Labs  Lab 05/11/21 0703 05/11/21 0910  TROPONINIHS 6 6     Fasting Lipid Panel Recent  Labs    05/14/21 0148  CHOL 115  HDL 39*  LDLCALC 64  TRIG 60  CHOLHDL 2.9   Thyroid Function Tests No results for input(s): TSH, T4TOTAL, T3FREE, THYROIDAB in the last 72 hours.  Invalid input(s): FREET3 _____________  DG Chest 2 View  Result Date: 05/11/2021 CLINICAL DATA:  Chest pain EXAM: CHEST - 2 VIEW COMPARISON:  03/21/2021 FINDINGS: The lungs are clear without focal pneumonia, edema, pneumothorax or pleural effusion. The cardiopericardial silhouette is within normal limits for size. The visualized bony structures of the thorax show no acute abnormality. Status post lower thoracic vertebral augmentation. Thoracic spinal stimulator device again noted. IMPRESSION: No active cardiopulmonary disease. Electronically Signed   By: Misty Stanley M.D.   On: 05/11/2021 07:37   CARDIAC CATHETERIZATION  Result Date: 05/13/2021 Conclusions: Severe  two-vessel coronary artery disease with sequential ostial through distal LAD stenoses of up to 70% shown to be hemodynamically significant on recent CT-FFR as well as 99% stenosis of tiny D1 branch and chronic total occlusion of the mid RCA with left-right collaterals.  Moderate, nonobstructive CAD noted in the mid LCx and OM1. Normal left ventricular filling pressure (LVEDP 10 mmHg). Successful PCI to proximal and mid LAD using nonoverlapping Onyx Frontier 3.0 x 12 mm (proximal) and 2.75 x 15 mm (mid) drug-eluting stents with 0% residual stenosis and TIMI-3 flow. Recommendations: Dual antiplatelet therapy with aspirin and ticagrelor for at least 12 months. Aggressive secondary prevention of coronary artery disease. Add isosorbide mononitrate for medical therapy of D1 and RCA disease.  Wean nitroglycerin infusion as tolerated. Nelva Bush, MD [facility]  CT CORONARY [facility] W/CTA COR W/SCORE Lewanda Rife W/CM &/OR WO/CM  Addendum Date: 04/22/2021   ADDENDUM REPORT: 04/22/2021 17:58 CLINICAL DATA:  65 Year old White male EXAM: Cardiac/Coronary  CTA  TECHNIQUE: The patient was scanned on a [employer]. FINDINGS: Scan was triggered in the descending thoracic aorta. Axial non-contrast 3 mm slices were carried out through the heart. The data set was analyzed on a dedicated work station and scored using the [place]. Gantry rotation speed was 250 msecs and collimation was .6 mm. 0.8 mg of sl NTG was given. The 3D data set was reconstructed in 5% intervals of the 67-82 % of the R-R cycle. Diastolic phases were analyzed on a dedicated work station using MPR, MIP and VRT modes. The patient received 95 cc of contrast. Aorta:  Normal size.  No calcifications.  No dissection. Main Pulmonary Artery: Normal size of the pulmonary artery. Aortic Valve:  Tri-leaflet.  No calcifications. Coronary Arteries:  Normal coronary origin.  Right dominance. Coronary Calcium Score: Left main: 68 Left anterior descending artery: 589 Left circumflex artery: 336 Right coronary artery: 321 Total: 1313 Percentile: 94th for age, sex, and race matched control. RCA is a large dominant artery that gives rise to PDA and PLA. There is 50-70% moderate stenosis calcified plaque in the mid RCA. Left main is a large artery that gives rise to LAD and LCX arteries. There is a minimal distal lesions that leads into ostial LAD and LCX lesions. LAD is a large vessel that gives rise to one large D1 Branch. There is a 25-49% mild calcified plaque in the ostial LAD. There is a moderate stenosis calcified plaque in the proximal and mid LAD with soft plaque mild obstruction distal to the mLAD moderate stenosis and distal LAD calcifications. There is a moderate stenosis in the D1 and mild non obstructive disease in the diagonal body. LCX is a non-dominant artery that gives rise to two OM branches. There is a minimal non obstructive mixed plaque proximal lesion and a calcified moderate stenosis at the OM1 take off and into the mid LCX. Luminal irregularities in the OM1. Other findings: Normal  pulmonary vein drainage into the left atrium. Normal left atrial appendage without a thrombus. Extra-cardiac findings: See attached radiology report for non-cardiac structures. Slab artifact noted. IMPRESSION: 1. Coronary calcium score of 1313. This was 94th percentile for age, sex, and race matched control. 2. Normal coronary origin with right dominance. 3. CAD-RADS 3. Moderate stenosis. Consider symptom-guided anti-ischemic pharmacotherapy as well as risk factor modification per guideline directed care. Additional analysis with CT FFR will be submitted. RECOMMENDATIONS: Coronary artery calcium (CAC) score is a strong predictor of incident coronary heart disease (CHD) and provides  predictive information beyond traditional risk factors. CAC scoring is reasonable to use in the decision to withhold, postpone, or initiate statin therapy in intermediate-risk or selected borderline-risk asymptomatic adults (age 49-75 years and LDL-C >=70 to <190 mg/dL) who do not have diabetes or established atherosclerotic cardiovascular disease (ASCVD).* In intermediate-risk (10-year ASCVD risk >=7.5% to <20%) adults or selected borderline-risk (10-year ASCVD risk >=5% to <7.5%) adults in whom a CAC score is measured for the purpose of making a treatment decision the following recommendations have been made: If CAC = 0, it is reasonable to withhold statin therapy and reassess in 5 to 10 years, as long as higher risk conditions are absent (diabetes mellitus, family history of premature CHD in first degree relatives (males <55 years; females <65 years), cigarette smoking, LDL >=190 mg/dL or other independent risk factors). If CAC is 1 to 99, it is reasonable to initiate statin therapy for patients >=72 years of age. If CAC is >=100 or >=75th percentile, it is reasonable to initiate statin therapy at any age. Cardiology referral should be considered for patients with CAC scores =400 or >=75th percentile. *2018  AHA/ACC/AACVPR/AAPA/ABC/ACPM/ADA/AGS/APhA/ASPC/NLA/PCNA Guideline on the Management of Blood Cholesterol: A Report of the American College of Cardiology/American Heart Association Task Force on Clinical Practice Guidelines. J Am Coll Cardiol. 2019;73(24):3168-3209. Rudean Haskell, MD Electronically Signed   By: Rudean Haskell M.D.   On: 04/22/2021 17:58   Result Date: 04/22/2021 EXAM: OVER-READ INTERPRETATION  CT CHEST The following report is an over-read performed by radiologist Dr. Vinnie Langton of [facility], [place] on 04/22/2021. This over-read does not include interpretation of cardiac or coronary anatomy or pathology. The coronary calcium score/coronary CTA interpretation by the cardiologist is attached. COMPARISON:  None. FINDINGS: Within the visualized portions of the thorax there are no suspicious appearing pulmonary nodules or masses, there is no acute consolidative airspace disease, no pleural effusions, no pneumothorax and no lymphadenopathy. Visualized portions of the upper abdomen are unremarkable. There are no aggressive appearing lytic or blastic lesions noted in the visualized portions of the skeleton. Spinal cord stimulator noted in the midthoracic region. Post vertebroplasty changes of a lower thoracic vertebral body incidentally noted. IMPRESSION: 1. No significant incidental noncardiac findings are noted. Electronically Signed: By: Vinnie Langton M.D. On: 04/22/2021 11:51   CT CORONARY FRACTIONAL FLOW RESERVE FLUID ANALYSIS  Result Date: 04/22/2021 EXAM: CT-FFR ANALYSIS CLINICAL DATA:  Possibly obstructive coronary lesion, mRCA, mLAD, and mLCx. FINDINGS: CT-FFR analysis was performed on the original cardiac CT angiogram dataset. Diagrammatic representation of the CT-FFR analysis is provided in a separate PDF document in PACS. This dictation was created using the PDF document and an interactive 3D model of the results. 3D model is not available in the EMR/PACS.  Normal FFR range is >0.80. 1. Left Main: No significant functional stenosis. 2. LAD: significant functional stenosis, CT-FFR 0.83-> 0.77 at mLAD lesion. Ostial and Proximal LAD lesions do not show significant functional stenosis. 3. LCX: No significant functional stenosis, at [facility], CT-FFR 0.89-> 0.85. 4. RCA: Modeled as CTO after mRCA lesion. IMPRESSION: 1. CT FFR analysis shows evidence of significant functional stenosis. Rudean Haskell MD Electronically Signed   By: Rudean Haskell M.D.   On: 04/22/2021 18:07   CT ANGIO CHEST AORTA W/CM & OR WO/CM  Result Date: 05/11/2021 CLINICAL DATA:  Chest and back pain, initial encounter EXAM: CT ANGIOGRAPHY CHEST WITH CONTRAST TECHNIQUE: Multidetector CT imaging of the chest was performed using the standard protocol during bolus administration of intravenous  contrast. Multiplanar CT image reconstructions and MIPs were obtained to evaluate the vascular anatomy. CONTRAST:  136mL OMNIPAQUE IOHEXOL 350 MG/ML SOLN COMPARISON:  Plain film from earlier in the same day. FINDINGS: Cardiovascular: Thoracic aorta demonstrates atherosclerotic calcification without aneurysmal dilatation or dissection. No cardiac enlargement is seen. Pulmonary artery is well visualized without evidence of pulmonary embolism. Scattered coronary calcifications are noted. Mediastinum/Nodes: Thoracic inlet is within normal limits. No sizable hilar or mediastinal adenopathy is noted. The esophagus as visualized is within normal limits. Lungs/Pleura: Lungs are well aerated bilaterally. Diffuse emphysematous changes are seen. Mild dependent atelectatic changes are noted. No focal infiltrate or effusion is noted. No parenchymal nodules are seen. Upper Abdomen: Right renal cyst is noted. No other focal abnormality in the upper abdomen is seen. Musculoskeletal: Degenerative changes of the thoracic spine are noted. No rib abnormality is noted. Changes of prior vertebral augmentation and spinal  stimulator placement are noted. Review of the MIP images confirms the above findings. IMPRESSION: No evidence of aortic dissection. No pulmonary embolism is seen. Aortic Atherosclerosis (ICD10-I70.0) and Emphysema (ICD10-J43.9). Electronically Signed   By: Inez Catalina M.D.   On: 05/11/2021 21:41   ECHOCARDIOGRAM COMPLETE  Result Date: 05/12/2021    ECHOCARDIOGRAM REPORT   Patient Name:   Jordan Lambert Date of Exam: 05/12/2021 Medical Rec #:  [id number]     Height:       68.0 in Accession #:    [id number]    Weight:       157.7 lb Date of Birth:  02-23-1956     BSA:          1.847 m Patient Age:    41 years      BP:           171/94 mmHg Patient Gender: M             HR:           81 bpm. Exam Location:  Inpatient Procedure: 2D Echo, Cardiac Doppler and Color Doppler Indications:    chest pain  History:        Patient has no prior history of Echocardiogram examinations.                 Risk Factors:Hypertension and Dyslipidemia.  Sonographer:    Melissa Morford RDCS (AE, PE) Referring Phys: [id number] [place]  1. Mild hypokinesis of the base/mid inferior/inferoseptal walls. . Left ventricular ejection fraction, by estimation, is 50 to 55%. The left ventricle has low normal function.  2. Right ventricular systolic function is normal. The right ventricular size is normal.  3. Trivial mitral valve regurgitation.  4. The aortic valve is tricuspid. Aortic valve regurgitation is not visualized. Aortic valve sclerosis is present, with no evidence of aortic valve stenosis. FINDINGS  Left Ventricle: Mild hypokinesis of the base/mid inferior/inferoseptal walls. Left ventricular ejection fraction, by estimation, is 50 to 55%. The left ventricle has low normal function. The left ventricular internal cavity size was normal in size. There is no left ventricular hypertrophy. Right Ventricle: The right ventricular size is normal. Right vetricular wall thickness was not assessed. Right ventricular systolic  function is normal. Left Atrium: Left atrial size was normal in size. Right Atrium: Right atrial size was normal in size. Pericardium: There is no evidence of pericardial effusion. Mitral Valve: There is mild thickening of the mitral valve leaflet(s). Trivial mitral valve regurgitation. Tricuspid Valve: The tricuspid valve is normal in structure. Tricuspid valve regurgitation is trivial.  Aortic Valve: The aortic valve is tricuspid. Aortic valve regurgitation is not visualized. Aortic valve sclerosis is present, with no evidence of aortic valve stenosis. Pulmonic Valve: The pulmonic valve was normal in structure. Pulmonic valve regurgitation is trivial. Aorta: The aortic root is normal in size and structure. IAS/Shunts: No atrial level shunt detected by color flow Doppler.  LEFT VENTRICLE PLAX 2D LVIDd:         4.90 cm     Diastology LVIDs:         3.40 cm     LV e' medial:    5.66 cm/s LV PW:         0.90 cm     LV E/e' medial:  13.6 LV IVS:        0.90 cm     LV e' lateral:   8.05 cm/s LVOT diam:     2.20 cm     LV E/e' lateral: 9.6 LV SV:         70 LV SV Index:   38 LVOT Area:     3.80 cm  LV Volumes (MOD) LV vol d, MOD A2C: 74.5 ml LV vol d, MOD A4C: 56.8 ml LV vol s, MOD A2C: 37.5 ml LV vol s, MOD A4C: 36.2 ml LV SV MOD A2C:     37.0 ml LV SV MOD A4C:     56.8 ml LV SV MOD BP:      29.6 ml RIGHT VENTRICLE TAPSE (M-mode): 2.4 cm LEFT ATRIUM             Index        RIGHT ATRIUM           Index LA diam:        4.30 cm 2.33 cm/m   RA Area:     14.40 cm LA Vol (A2C):   37.0 ml 20.03 ml/m  RA Volume:   34.10 ml  18.46 ml/m LA Vol (A4C):   59.5 ml 32.21 ml/m LA Biplane Vol: 47.5 ml 25.71 ml/m  AORTIC VALVE LVOT Vmax:   95.80 cm/s LVOT Vmean:  63.300 cm/s LVOT VTI:    0.185 m  AORTA Ao Root diam: 3.00 cm Ao Asc diam:  2.90 cm MITRAL VALVE MV Area (PHT): 3.50 cm    SHUNTS MV Decel Time: 217 msec    Systemic VTI:  0.18 m MV E velocity: 77.10 cm/s  Systemic Diam: 2.20 cm MV A velocity: 93.40 cm/s MV E/A ratio:   0.83 Dorris Carnes MD Electronically signed by Dorris Carnes MD Signature Date/Time: 05/12/2021/1:36:06 PM    Final    Disposition   Pt is being discharged home today in good condition.  Follow-up Plans & Appointments     Follow-up Information     Erma Heritage, PA-C Follow up on 06/11/2021.   Specialties: Physician Assistant, Cardiology Why: @2 :30pm for hospital follow up with Dr. Nelly Laurence PA Contact information: [place] [address] [phone]         Monico Blitz, MD. Call.   Specialty: Internal Medicine Why: Follow up with PCP within 1-2 weeks Contact information: [place] [address] [phone]         Arnoldo Lenis, MD .   Specialty: Cardiology Contact information: [place] [address] [phone]                Discharge Instructions     Amb Referral to Cardiac Rehabilitation  Complete by: As directed    Diagnosis: Coronary Stents   After initial evaluation and assessments completed: Virtual Based Care may be provided alone or in conjunction with Phase 2 Cardiac Rehab based on patient barriers.: Yes   Call MD for:  difficulty breathing, headache or visual disturbances   Complete by: As directed    Call MD for:  extreme fatigue   Complete by: As directed    Call MD for:  hives   Complete by: As directed    Call MD for:  persistant dizziness or light-headedness   Complete by: As directed    Call MD for:  persistant nausea and vomiting   Complete by: As directed    Call MD for:  redness, tenderness, or signs of infection (pain, swelling, redness, odor or green/yellow discharge around incision site)   Complete by: As directed    Call MD for:  severe uncontrolled pain   Complete by: As directed    Call MD for:  temperature >100.4   Complete by: As directed    Diet - low sodium heart healthy   Complete by: As directed    Discharge instructions   Complete by: As directed    You were  cared for by a hospitalist during your hospital stay. If you have any questions about your discharge medications or the care you received while you were in the hospital after you are discharged, you can call the unit and ask to speak with the hospitalist on call if the hospitalist that took care of you is not available. Once you are discharged, your primary care physician will handle any further medical issues. Please note that NO REFILLS for any discharge medications will be authorized once you are discharged, as it is imperative that you return to your primary care physician (or establish a relationship with a primary care physician if you do not have one) for your aftercare needs so that they can reassess your need for medications and monitor your lab values.  Follow up with PCP and Cardiology within 1-2 weeks. Take all medications as prescribed. If symptoms change or worsen please return to the ED for evaluation   Increase activity slowly   Complete by: As directed    No wound care   Complete by: As directed        Discharge Medications   Allergies as of 05/14/2021       Reactions   Sulfamethoxazole-trimethoprim Rash   Sores in mouth        Medication List     TAKE these medications    amLODipine 5 MG tablet Commonly known as: NORVASC Take 1 tablet (5 mg total) by mouth daily.   aspirin EC 81 MG tablet Take 1 tablet (81 mg total) by mouth daily. Swallow whole.   atorvastatin 80 MG tablet Commonly known as: LIPITOR Take 1 tablet (80 mg total) by mouth daily. What changed:  medication strength how much to take   CertaVite/Antioxidants Tabs Take 1 tablet by mouth daily. Start taking on: May 15, 2021   HYDROmorphone 4 MG tablet Commonly known as: DILAUDID Take 4 mg by mouth every 6 (six) hours as needed for moderate pain.   ibuprofen 200 MG tablet Commonly known as: ADVIL Take 600 mg by mouth every 8 (eight) hours as needed for moderate pain.   isosorbide  mononitrate 30 MG 24 hr tablet Commonly known as: IMDUR Take 1 tablet (30 mg total) by mouth daily.   lisinopril 10 MG tablet  Commonly known as: ZESTRIL Take 10 mg by mouth daily.   metoprolol tartrate 25 MG tablet Commonly known as: LOPRESSOR Take 1/2 tablet (12.5 mg total) by mouth 2 (two) times daily.   nitroGLYCERIN 0.4 MG SL tablet Commonly known as: NITROSTAT Place 1 tablet (0.4 mg total) under the tongue every 5 (five) minutes as needed for chest pain.   ticagrelor 90 MG Tabs tablet Commonly known as: BRILINTA Take 1 tablet (90 mg total) by mouth 2 (two) times daily.   traZODone 100 MG tablet Commonly known as: DESYREL Take 100 mg by mouth at bedtime.         Outstanding Labs/Studies   Consider OP f/u labs 6-8 weeks given statin initiation this admission.   Duration of Discharge Encounter   Greater than 30 minutes including physician time.  Jarrett Soho, PA 05/14/2021, 12:26 PM

## 2021-05-14 NOTE — Progress Notes (Signed)
Mobility Specialist Progress Note    05/14/21 1136  Mobility  Activity Ambulated in hall  Level of Assistance Independent  Assistive Device None  Distance Ambulated (ft) 300 ft  Mobility Ambulated independently in hallway  Mobility Response Tolerated well  Mobility performed by Mobility specialist  $Mobility charge 1 Mobility   Pt received in bed and agreeable. No complaints on walk .Returned to sitting EOB with call bell in reach and family present.   [facility] Mobility Specialist  M.S. Primary Phone: [phone] M.S. Secondary Phone: [phone]

## 2021-05-14 NOTE — Progress Notes (Signed)
CARDIAC REHAB PHASE I   PRE:  Rate/Rhythm: 88 SR  BP:  Sitting: 154/85     SaO2: 95 RA  MODE:  Ambulation: 250 ft   POST:  Rate/Rhythm: 115 ST  BP:  Sitting: 166/90    SaO2: 96 RA   Pt ambulated 239ft in hallway standby assist with slow gait. Pt states mobility is limited due to knee and hip pain, denies falls. Pt returned to recliner and stent education completed. Pt educated on importance of ASA and Brilinta. Pt given heart healthy diet. Reviewed site care, restrictions, and exercise guidelines. Will refer to CRP II .  [id number] Rufina Falco, RN BSN 05/14/2021 8:47 AM

## 2021-05-14 NOTE — TOC Benefit Eligibility Note (Signed)
Patient Teacher, English as a foreign language completed.    The patient is currently admitted and upon discharge could be taking Brilinta 90 mg.  The current 30 day co-pay is, $299.27 due to a $138.21 deductible remaining.   The patient is insured through [employer] Patient Advocate Specialist [place] Patient Advocate Team Direct Number: [phone]  Fax: [phone]

## 2021-05-14 NOTE — Progress Notes (Signed)
PROGRESS NOTE    Jordan Lambert  UPJ:[id number] DOB: Nov 19, 1955 DOA: 05/11/2021 PCP: Monico Blitz, MD   Brief Narrative:  The patient is a 65 year old Caucasian male with a past medical history significant for but not limited to CAD, hypertension, chronic systolic CHF with an LVEF of 46% on stress testing in 2018, OSA not on CPAP, peripheral neuropathy as well as other comorbidities who came in with recurrent chest pain.  Patient started to have intermittent chest pain about 5 to 6 weeks ago when he went to [facility] on 03/21/2021 and troponins were negative and PE study was negative and he was discharged home.  Subsequently patient had several episodes of chest pain and cardiology did a coronary CTA which showed mid LAD greater than 70% stenosis on 04/22/2021.  He is scheduled to have a cardiac cath on 05/15/2021.  The day before admission in the evening after dinner around 7 PM the patient started having another episode of chest pain felt like a squeezing that radiated to his left arm and he described it as a 9 out of 10 in severity but denied any palpitations, sweating nausea or vomiting.  Checked his blood pressure is noted to be current 929 systolic.  He took 181 mg of aspirin and chest pain subsided.  On the morning of admission the chest pain came back so he decided come to the ED for further evaluation.  He is noted to have significantly elevated blood pressure and EKG showed chronic ST changes in leads II, III and aVF.  Troponin was negative x2.  Cardiology evaluated patient was placed on a heparin drip and nitroglycerin drip.  Currently he is being admitted and treated for chest pain with concern for unstable angina.  Cardiology is following and he underwent cardiac catheterization and was found to have severe two-vessel CAD with sequential ostial to distal LAD stenosis of up to 70% and subsequently he was underwent successful PCI to proximal and mid LAD using nonoverlapping Onyx from 2 to 3.0 x  12 mm and 2.75 x 50 mm drug-eluting stents.  He was placed on dual antiplatelet therapy with aspirin and Brilinta and then was given isosorbide mononitrate and beta-blocker.  He was deemed to be stable to be discharged and cardiology is now discharge the patient home  Assessment & Plan:   Principal Problem:   Unstable angina ([place]) Active Problems:   Angina pectoris (HCC)   Tobacco abuse   Hyperlipidemia with target LDL less than 70   Hypertension   Malnutrition of moderate degree  Chest pain rule out ACS with concern for unstable angina at the setting of known LAD disease -Presented with substernal chest tightness that has been going on since the day before admission his evening in the setting of significant mid LAD disease with a positive FFR noted on CTA of the chest -Cycle troponins were negative x2 as needed 6 both times -EKG did not show any ischemic changes -CTA Chest Aorta showed "No evidence of aortic dissection. No pulmonary embolism is seen. Aortic Atherosclerosis and Emphysema."  -Cardiology consulted and recommending cardiac cath this admission -Given that he continued have significant pain they started and initiated a nitroglycerin drip as well as a heparin drip and recommending continue to monitor for symptoms -They do not feel that the cath is urgent at this time unless symptoms are refractory to EKG changes or if troponins rise -We will continue his ACS meds including a heparin drip, aspirin 81 mg p.o. daily, rosuvastatin 40  mg nightly, lisinopril 10 mg p.o. daily -Patient had TTE done yesterday which showed "Mild hypokinesis of the base/mid inferior/inferoseptal walls. Left ventricular ejection fraction, by estimation, is 50 to 55%. The left  ventricle has low normal function. Right ventricular systolic function is normal. The right ventricular size is normal. Trivial mitral valve regurgitation. The aortic valve is tricuspid. Aortic valve regurgitation is not visualized.  Aortic valve sclerosis is present, with no evidence of aortic valve stenosis."  -Also has NTG 0.4 mg slL q62minprn Chest Pain  -His beta-blocker has been held due to his bradycardia; Last HR was documented to be 42 but now cardiology is adding low-dose metoprolol -Cardiology is planning a cardiac catheterization today and patient is still having some mild chest pain 1 or 2 out of 10 in severity -Further care per cardiology and cardiac catheterization done and he ended up getting successful PCI of proximal mid LAD with 2 DES and is now on dual antiplatelet therapy with aspirin and Brilinta -Continue to monitor on telemetry -Further care per cardiology and he is deemed stable for discharge at this time will need to follow-up with him in outpatient setting; he is now on statin Imdur, beta-blocker, dual antiplatelet therapy with aspirin and Brilinta as well as lisinopril as above; he will continue be on lisinopril  Hypokalemia -Mild. Potassium is now stable at 3.5 and mag level is 1.8 so we will treat prior to discharge -Continue to monitor and replete as necessary -Repeat CMP in a.m.  Headache, improved -From Nitro gtt and improved once nitro drip was stopped -Try Acetaminophen 1000 mg po q6hprn Headache -Continue to monitor as patient still has a headache and may need Fioricet  Leg Pain with Chronic Nerve Damage/Chronic Neck Pain  -Has Neuropathy and Chronic Leg Pain from falling out of a tree and getting Cervical Fusion and 3-4 Anterior cervical decompression/discectomy fusion -C/w Hydromorphone 4 mg po q6hprn Moderate Pain -If Necessary will add meds for breakthrough pain and will consider Mary gabapentin  Leukocytosis -Mild and Likely Reactive -Patient's WBC went from 9.8 -> 10.8 and is now 10.8 again today -Continue to Monitor for S/Sx of Infection -Repeat CBC within 1 week  HLD -C/w Rosuvastatin 40 mg po Daily    Uncontrolled Hypertension -On nitroglycerin drip -Resumed Lisinopril  10 mg po Daily and has now been started on Amlodipine 5 mg po daily by Cardiology; continue with these medications and cardiology is added a beta-blocker and Imdur -Continue to monitor blood pressures per protocol -Last blood pressure reading was 253/66   Chronic Systolic CHF -Euvolemic, aggressive BP management. -Strict I's and O's and Daily Weights; Patient is - 936.8 liters since Admission -Further care per cardiology  Tobacco Abuse -Smoking Cessation Counseling given   OSA -Dose not use CPAP  DVT prophylaxis: Anticoagulated with Heparin gtt and was transitioned to subcu Code Status: FULL CODE Family Communication: No family currently at bedside Disposition Plan: Cardiology is discharging the patient home today  Status is: Inpatient  Remains inpatient appropriate because: Plan is for Cardiac Cath  Consultants:  Cardiology   Procedures: ECHOCARDIOGRAM  IMPRESSIONS     1. Mild hypokinesis of the base/mid inferior/inferoseptal walls. . Left  ventricular ejection fraction, by estimation, is 50 to 55%. The left  ventricle has low normal function.   2. Right ventricular systolic function is normal. The right ventricular  size is normal.   3. Trivial mitral valve regurgitation.   4. The aortic valve is tricuspid. Aortic valve regurgitation is not  visualized. Aortic valve sclerosis is present, with no evidence of aortic  valve stenosis.   FINDINGS   Left Ventricle: Mild hypokinesis of the base/mid inferior/inferoseptal  walls. Left ventricular ejection fraction, by estimation, is 50 to 55%.  The left ventricle has low normal function. The left ventricular internal  cavity size was normal in size.  There is no left ventricular hypertrophy.   Right Ventricle: The right ventricular size is normal. Right vetricular  wall thickness was not assessed. Right ventricular systolic function is  normal.   Left Atrium: Left atrial size was normal in size.   Right Atrium: Right  atrial size was normal in size.   Pericardium: There is no evidence of pericardial effusion.   Mitral Valve: There is mild thickening of the mitral valve leaflet(s).  Trivial mitral valve regurgitation.   Tricuspid Valve: The tricuspid valve is normal in structure. Tricuspid  valve regurgitation is trivial.   Aortic Valve: The aortic valve is tricuspid. Aortic valve regurgitation is  not visualized. Aortic valve sclerosis is present, with no evidence of  aortic valve stenosis.   Pulmonic Valve: The pulmonic valve was normal in structure. Pulmonic valve  regurgitation is trivial.   Aorta: The aortic root is normal in size and structure.   IAS/Shunts: No atrial level shunt detected by color flow Doppler.      LEFT VENTRICLE  PLAX 2D  LVIDd:         4.90 cm     Diastology  LVIDs:         3.40 cm     LV e' medial:    5.66 cm/s  LV PW:         0.90 cm     LV E/e' medial:  13.6  LV IVS:        0.90 cm     LV e' lateral:   8.05 cm/s  LVOT diam:     2.20 cm     LV E/e' lateral: 9.6  LV SV:         70  LV SV Index:   38  LVOT Area:     3.80 cm     LV Volumes (MOD)  LV vol d, MOD A2C: 74.5 ml  LV vol d, MOD A4C: 56.8 ml  LV vol s, MOD A2C: 37.5 ml  LV vol s, MOD A4C: 36.2 ml  LV SV MOD A2C:     37.0 ml  LV SV MOD A4C:     56.8 ml  LV SV MOD BP:      29.6 ml   RIGHT VENTRICLE  TAPSE (M-mode): 2.4 cm   LEFT ATRIUM             Index        RIGHT ATRIUM           Index  LA diam:        4.30 cm 2.33 cm/m   RA Area:     14.40 cm  LA Vol (A2C):   37.0 ml 20.03 ml/m  RA Volume:   34.10 ml  18.46 ml/m  LA Vol (A4C):   59.5 ml 32.21 ml/m  LA Biplane Vol: 47.5 ml 25.71 ml/m   AORTIC VALVE  LVOT Vmax:   95.80 cm/s  LVOT Vmean:  63.300 cm/s  LVOT VTI:    0.185 m     AORTA  Ao Root diam: 3.00 cm  Ao Asc diam:  2.90 cm   MITRAL VALVE  MV Area (  PHT): 3.50 cm    SHUNTS  MV Decel Time: 217 msec    Systemic VTI:  0.18 m  MV E velocity: 77.10 cm/s  Systemic Diam: 2.20 cm  MV  A velocity: 93.40 cm/s  MV E/A ratio:  0.83    Cardiac Catheterization Conclusions: Severe two-vessel coronary artery disease with sequential ostial through distal LAD stenoses of up to 70% shown to be hemodynamically significant on recent CT-FFR as well as 99% stenosis of tiny D1 branch and chronic total occlusion of the mid RCA with left-right collaterals.  Moderate, nonobstructive CAD noted in the mid LCx and OM1. Normal left ventricular filling pressure (LVEDP 10 mmHg). Successful PCI to proximal and mid LAD using nonoverlapping Onyx Frontier 3.0 x 12 mm (proximal) and 2.75 x 15 mm (mid) drug-eluting stents with 0% residual stenosis and TIMI-3 flow.   Recommendations: Dual antiplatelet therapy with aspirin and ticagrelor for at least 12 months. Aggressive secondary prevention of coronary artery disease. Add isosorbide mononitrate for medical therapy of D1 and RCA disease.  Wean nitroglycerin infusion as tolerated.    Antimicrobials:  Anti-infectives (From admission, onward)    None        Subjective: Seen and examined at bedside and he was getting dressed and ready to go home.  Denies any chest pain or shortness breath.  Headache is improved.  Feels well and back to his baseline and ready to follow-up with cardiology in the outpatient setting.  Objective: Vitals:   05/13/21 1536 05/13/21 2124 05/14/21 0551 05/14/21 0842  BP: (!) 144/76 (!) 147/68 (!) 152/77 (!) 166/90  Pulse:  83 63 89  Resp:  17 18   Temp: 97.8 F (36.6 C) 98.5 F (36.9 C) 97.8 F (36.6 C)   TempSrc: Oral Oral Oral   SpO2:  95% 99%   Weight:      Height:        Intake/Output Summary (Last 24 hours) at 05/14/2021 1722 Last data filed at 05/14/2021 0540 Gross per 24 hour  Intake 469.65 ml  Output 1375 ml  Net -905.35 ml    Filed Weights   05/11/21 0643 05/11/21 1649 05/13/21 6378  Weight: 73.5 kg 71.5 kg 70.9 kg   Examination: Physical Exam:  Constitutional: WN/WD Caucasian male currently in  no acute distress Eyes: Lids and conjunctivae normal, sclerae anicteric  ENMT: External Ears, Nose appear normal. Grossly normal hearing. Neck: Appears normal, supple, no cervical masses, normal ROM, no appreciable thyromegaly: No appreciable JVD Respiratory: Diminished to auscultation bilaterally, no wheezing, rales, rhonchi or crackles. Normal respiratory effort and patient is not tachypenic. No accessory muscle use.  Unlabored breathing Cardiovascular: RRR, no murmurs / rubs / gallops. S1 and S2 auscultated.  Slight lower extremity edema Abdomen: Soft, non-tender, non-distended.  Bowel sounds positive.  GU: Deferred. Musculoskeletal: No clubbing / cyanosis of digits/nails. No joint deformity upper and lower extremities. Skin: No rashes, lesions, ulcers on limited skin evaluation. No induration; Warm and dry.  Neurologic: CN 2-12 grossly intact with no focal deficits.  Romberg sign and cerebellar reflexes not assessed.  Psychiatric: Normal judgment and insight. Alert and oriented x 3. Normal mood and appropriate affect.   Data Reviewed: I have personally reviewed following labs and imaging studies  CBC: Recent Labs  Lab 05/11/21 0703 05/12/21 0322 05/13/21 0200 05/14/21 0148  WBC 10.2 9.8 10.8* 10.8*  NEUTROABS  --   --  6.4 7.0  HGB 14.6 14.7 14.4 13.5  HCT 43.4 42.7 42.9 39.9  MCV 91.8  90.7 92.3 91.7  PLT 235 204 212 256    Basic Metabolic Panel: Recent Labs  Lab 05/11/21 0703 05/12/21 0322 05/13/21 0200 05/14/21 0148  NA 137 137 137 137  K 3.9 3.4* 4.1 3.5  CL 105 104 107 105  CO2 24 23 23 23   GLUCOSE 151* 109* 119* 103*  BUN 7* 9 10 9   CREATININE 0.81 0.81 0.77 0.83  CALCIUM 9.4 9.2 9.2 9.0  MG  --  2.0 2.0 1.8  PHOS  --  4.0 3.6 4.5    GFR: Estimated Creatinine Clearance: 85.8 mL/min (by C-G formula based on SCr of 0.83 mg/dL). Liver Function Tests: Recent Labs  Lab 05/13/21 0200 05/14/21 0148  AST 20 26  ALT 30 32  ALKPHOS 71 65  BILITOT 0.9 1.2   PROT 6.2* 6.1*  ALBUMIN 3.7 3.4*    No results for input(s): LIPASE, AMYLASE in the last 168 hours. No results for input(s): AMMONIA in the last 168 hours. Coagulation Profile: No results for input(s): INR, PROTIME in the last 168 hours. Cardiac Enzymes: No results for input(s): CKTOTAL, CKMB, CKMBINDEX, TROPONINI in the last 168 hours. BNP (last 3 results) No results for input(s): PROBNP in the last 8760 hours. HbA1C: No results for input(s): HGBA1C in the last 72 hours. CBG: Recent Labs  Lab 05/11/21 1706  GLUCAP 98    Lipid Profile: Recent Labs    05/14/21 0148  CHOL 115  HDL 39*  LDLCALC 64  TRIG 60  CHOLHDL 2.9   Thyroid Function Tests: No results for input(s): TSH, T4TOTAL, FREET4, T3FREE, THYROIDAB in the last 72 hours. Anemia Panel: No results for input(s): VITAMINB12, FOLATE, FERRITIN, TIBC, IRON, RETICCTPCT in the last 72 hours. Sepsis Labs: No results for input(s): PROCALCITON, LATICACIDVEN in the last 168 hours.  Recent Results (from the past 240 hour(s))  Resp Panel by RT-PCR (Flu A&B, Covid) Nasopharyngeal Swab     Status: None   Collection Time: 05/11/21  9:01 AM   Specimen: Nasopharyngeal Swab; Nasopharyngeal(NP) swabs in vial transport medium  Result Value Ref Range Status   SARS Coronavirus 2 by RT PCR NEGATIVE NEGATIVE Final    Comment: (NOTE) SARS-CoV-2 target nucleic acids are NOT DETECTED.  The SARS-CoV-2 RNA is generally detectable in upper respiratory specimens during the acute phase of infection. The lowest concentration of SARS-CoV-2 viral copies this assay can detect is 138 copies/mL. A negative result does not preclude SARS-Cov-2 infection and should not be used as the sole basis for treatment or other patient management decisions. A negative result may occur with  improper specimen collection/handling, submission of specimen other than nasopharyngeal swab, presence of viral mutation(s) within the areas targeted by this assay, and  inadequate number of viral copies(<138 copies/mL). A negative result must be combined with clinical observations, patient history, and epidemiological information. The expected result is Negative.  Fact Sheet for Patients:  EntrepreneurPulse.com.au  Fact Sheet for Healthcare Providers:  IncredibleEmployment.be  This test is no t yet approved or cleared by the [place] FDA and  has been authorized for detection and/or diagnosis of SARS-CoV-2 by FDA under an Emergency Use Authorization (EUA). This EUA will remain  in effect (meaning this test can be used) for the duration of the COVID-19 declaration under Section 564(b)(1) of the Act, 21 U.S.C.section 360bbb-3(b)(1), unless the authorization is terminated  or revoked sooner.       Influenza A by PCR NEGATIVE NEGATIVE Final   Influenza B by PCR NEGATIVE NEGATIVE Final  Comment: (NOTE) The Xpert Xpress SARS-CoV-2/FLU/RSV plus assay is intended as an aid in the diagnosis of influenza from Nasopharyngeal swab specimens and should not be used as a sole basis for treatment. Nasal washings and aspirates are unacceptable for Xpert Xpress SARS-CoV-2/FLU/RSV testing.  Fact Sheet for Patients: EntrepreneurPulse.com.au  Fact Sheet for Healthcare Providers: IncredibleEmployment.be  This test is not yet approved or cleared by the [place] FDA and has been authorized for detection and/or diagnosis of SARS-CoV-2 by FDA under an Emergency Use Authorization (EUA). This EUA will remain in effect (meaning this test can be used) for the duration of the COVID-19 declaration under Section 564(b)(1) of the Act, 21 U.S.C. section 360bbb-3(b)(1), unless the authorization is terminated or revoked.  Performed at [facility], [place] [address], [place], [place] [address]    RN Pressure Injury Documentation:     Estimated body mass index is 23.77 kg/m as  calculated from the following:   Height as of this encounter: 5\' 8"  (1.727 m).   Weight as of this encounter: 70.9 kg.  Malnutrition Type: Nutrition Problem: Moderate Malnutrition Etiology: chronic illness (CAD and peripheral neuropathy) Malnutrition Characteristics: Signs/Symptoms: energy intake < 75% for > or equal to 1 month, mild fat depletion, moderate muscle depletion Nutrition Interventions: Interventions: Ensure Enlive (each supplement provides 350kcal and 20 grams of protein), MVI, Other (Comment) (provided information in AVS)  Radiology Studies: CARDIAC CATHETERIZATION  Result Date: 05/13/2021 Conclusions: Severe two-vessel coronary artery disease with sequential ostial through distal LAD stenoses of up to 70% shown to be hemodynamically significant on recent CT-FFR as well as 99% stenosis of tiny D1 branch and chronic total occlusion of the mid RCA with left-right collaterals.  Moderate, nonobstructive CAD noted in the mid LCx and OM1. Normal left ventricular filling pressure (LVEDP 10 mmHg). Successful PCI to proximal and mid LAD using nonoverlapping Onyx Frontier 3.0 x 12 mm (proximal) and 2.75 x 15 mm (mid) drug-eluting stents with 0% residual stenosis and TIMI-3 flow. Recommendations: Dual antiplatelet therapy with aspirin and ticagrelor for at least 12 months. Aggressive secondary prevention of coronary artery disease. Add isosorbide mononitrate for medical therapy of D1 and RCA disease.  Wean nitroglycerin infusion as tolerated. Nelva Bush, MD [facility]   Scheduled Meds:  amLODipine  5 mg Oral Daily   aspirin EC  81 mg Oral Daily   enoxaparin (LOVENOX) injection  40 mg Subcutaneous Q24H   feeding supplement  237 mL Oral BID BM   isosorbide mononitrate  30 mg Oral Daily   lisinopril  10 mg Oral Daily   metoprolol tartrate  12.5 mg Oral BID   multivitamin with minerals  1 tablet Oral Daily   rosuvastatin  40 mg Oral Daily   sodium chloride flush  3 mL Intravenous  Q12H   ticagrelor  90 mg Oral BID   traZODone  100 mg Oral QHS   Continuous Infusions:  sodium chloride     nitroGLYCERIN Stopped (05/13/21 1713)    LOS: 3 days   Kerney Elbe, DO [facility] PAGER is on AMION  If 7PM-7AM, please contact night-coverage [email or web address]

## 2021-05-14 NOTE — Care Management (Signed)
05-14-21 1312 Patient received medications from [facility]. Case Manager discussed cost for [place]- may be a little too expensive. Wife to explore the website for patient assistance to see if he qualifies. If not, the patient may pay the cost to meet his deductible vs switching to Plavix on the next appointment. No further needs from Case Manager at this time.

## 2021-05-15 ENCOUNTER — Other Ambulatory Visit (HOSPITAL_COMMUNITY): Payer: Self-pay

## 2021-05-15 ENCOUNTER — Ambulatory Visit (HOSPITAL_COMMUNITY)
Admission: RE | Admit: 2021-05-15 | Payer: Medicare Other | Source: Home / Self Care | Admitting: Interventional Cardiology

## 2021-05-15 SURGERY — LEFT HEART CATH AND CORONARY ANGIOGRAPHY
Anesthesia: LOCAL

## 2021-05-29 ENCOUNTER — Telehealth (HOSPITAL_COMMUNITY): Payer: Self-pay

## 2021-05-29 ENCOUNTER — Other Ambulatory Visit (HOSPITAL_COMMUNITY): Payer: Self-pay

## 2021-05-29 NOTE — Telephone Encounter (Signed)
° ° °  Pharmacy Transitions of Care Follow-up Telephone Call  Date of discharge: 05/14/21  Discharge Diagnosis: Unstable angina  How have you been since you were released from the hospital?  Patient doing well since discharge. Patient has ben splitting Brilinta in half and taking BID. Went over instructions with patient that he should be taking 1 tablet BID. Patient also concerned about cost of Brilinta. Encourage patient to speak with MD about applying for patient assistance. No other questions about meds at this time.  Medication changes made at discharge: START taking: amLODipine (NORVASC)  Brilinta (ticagrelor)  Certa[employer]ite/Antioxidants  isosorbide mononitrate (IMDUR)  metoprolol tartrate (LOPRESSOR)  nitroGLYCERIN (NITROSTAT)   Medication changes verified by the patient? Yes    Medication Accessibility:  Home Pharmacy:  [facility]  Was the patient provided with refills on discharged medications? Yes   Have all prescriptions been transferred from [facility] to home pharmacy?  Yes  Is the patient able to afford medications? Has insurance, is speaking with MD about assistance for Brilinta    Medication Review:  TICAGRELOR (BRILINTA) Ticagrelor 90 mg BID initiated on 05/14/21.  - Educated patient on expected duration of therapy of aspirin with ticagrelor.  - Discussed importance of taking medication around the same time every day, - Advised patient of medications to avoid (NSAIDs, aspirin maintenance doses>100 mg daily) - Educated that Tylenol (acetaminophen) will be the preferred analgesic to prevent risk of bleeding  - Emphasized importance of monitoring for signs and symptoms of bleeding (abnormal bruising, prolonged bleeding, nose bleeds, bleeding from gums, discolored urine, black tarry stools)  - Educated patient to notify doctor if shortness of breath or abnormal heartbeat occur - Advised patient to alert all providers of antiplatelet therapy prior to starting a new medication  or having a procedure   Follow-up Appointments:  [facility] f/u appt confirmed? Scheduled to see Dr. Ahmed Prima on 06/11/21 @ 2:30pm.   If their condition worsens, is the pt aware to call PCP or go to the Emergency Dept.? yes  Final Patient Assessment: Patient has follow up scheduled and refills at home pharmacy

## 2021-06-08 DIAGNOSIS — Z20828 Contact with and (suspected) exposure to other viral communicable diseases: Secondary | ICD-10-CM | POA: Diagnosis not present

## 2021-06-10 NOTE — Progress Notes (Addendum)
Cardiology Office Note    Date:  06/11/2021   ID:  HANFORD LUST, DOB [birth year], MRN [id number]  PCP:  Monico Blitz, MD  Cardiologist: Carlyle Dolly, MD    Chief Complaint  Patient presents with   Hospitalization Follow-up    History of Present Illness:    Jordan Lambert is a 66 y.o. male with past medical history of CAD (s/p Coronary CT in 04/2021 showing FFR positive stenosis along LAD), HTN, HLD, tobacco use and OSA (intolerant to CPAP) who presents to the office today for hospital follow-up.   He was examined by Dr. Harl Bowie in 04/2021 and recent Coronary CT was positive for a significant lesion along the mid-LAD, therefore a cardiac catheterization was recommended for further evaluation. He had pain in the interim which prompted ED evaluation and admission. Hs Troponin values were negative and EKG showed no acute ischemic changes. Echocardiogram showed his EF was in the low-normal range of 50 to 55% with mild hypokinesis along the basal/mid inferior/inferior septal walls. RV function was normal and he did have trivial MR. He underwent cath by Dr. Saunders Revel on 05/13/2021 which showed severe 2-vessel CAD with CTO of mid-RCA with L-->R collaterals and 70% stenosis along LAD and 99% stenosis of tiny D1 branch. He underwent PCI/DESx2 to the proximal and mid-LAD with medical management recommended of his D1 and RCA. He was started on ASA and Brilinta but notes mentioned possibly switching to Plavix if cost was an issue.   In talking with the patient today, he reports overall improvement in his symptoms since undergoing recent cardiac catheterization. Reports having brief episodes of chest pain in the morning hours at times which have resolved with SL NTG x1 and not as severe as his prior symptoms. Reports improvement in his breathing and no recent orthopnea, PND or pitting edema.  Reports good compliance with his medication regimen and has not missed any doses of ASA or Brilinta. No recent melena,  hematochezia or hematuria. Says Kary Kos is going to cost more than $400 for his next Rx. He has reduced his tobacco use from 4 cartons per month down to 1 carton per month.    Past Medical History:  Diagnosis Date   Arthritis    CAD (coronary artery disease)    a. s/p cath in 05/2021 which showed CTO of RCA with L-->R collaterals, 70% stenosis along LAD which was positive by FFR and 99% stenosis of small D1. 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   Sleep apnea    does not use cpap    Past Surgical History:  Procedure Laterality Date   ANTERIOR CERVICAL DECOMP/DISCECTOMY FUSION N/A 02/20/2017   Procedure: CERVICAL THREE-FOUR ANTERIOR CERVICAL DECOMPRESSION/DISCECTOMY FUSION;  Surgeon: Eustace Moore, MD;  Location: [facility];  Service: Neurosurgery;  Laterality: N/A;   APPENDECTOMY     BACK SURGERY     CORONARY STENT INTERVENTION N/A 05/13/2021   Procedure: CORONARY STENT INTERVENTION;  Surgeon: Nelva Bush, MD;  Location: [facility];  Service: Cardiovascular;  Laterality: N/A;   HERNIA REPAIR     LEFT HEART CATH AND CORONARY ANGIOGRAPHY N/A 05/13/2021   Procedure: LEFT HEART CATH AND CORONARY ANGIOGRAPHY;  Surgeon: Nelva Bush, MD;  Location: [facility];  Service: Cardiovascular;  Laterality: N/A;   neck fusion     nerve stimulor placed     sciatica      Current Medications: Outpatient Medications Prior to Visit  Medication Sig Dispense Refill   amLODipine (NORVASC) 5 MG tablet Take 1 tablet (5 mg total) by mouth daily. 30 tablet 11   aspirin EC 81 MG tablet Take 1 tablet (81 mg total) by mouth daily. Swallow whole.     HYDROmorphone (DILAUDID) 4 MG tablet Take 4 mg by mouth every 6 (six) hours as needed for moderate pain.     ibuprofen (ADVIL) 200 MG  tablet Take 600 mg by mouth every 8 (eight) hours as needed for moderate pain.     lisinopril (PRINIVIL,ZESTRIL) 10 MG tablet Take 10 mg by mouth daily.  1   Multiple Vitamins-Minerals (CERTAVITE/ANTIOXIDANTS) TABS Take 1 tablet by mouth daily. 30 tablet 0   nitroGLYCERIN (NITROSTAT) 0.4 MG SL tablet Place 1 tablet (0.4 mg total) under the tongue every 5 (five) minutes as needed for chest pain. 25 tablet 1   traZODone (DESYREL) 100 MG tablet Take 100 mg by mouth at bedtime.     atorvastatin (LIPITOR) 80 MG tablet Take 1 tablet (80 mg total) by mouth daily.     isosorbide mononitrate (IMDUR) 30 MG 24 hr tablet Take 1 tablet (30 mg total) by mouth daily. 30 tablet 11   metoprolol tartrate (LOPRESSOR) 25 MG tablet Take 1/2 tablet (12.5 mg total) by mouth 2 (two) times daily. 30 tablet 11   ticagrelor (BRILINTA) 90 MG TABS tablet Take 1 tablet (90 mg total) by mouth 2 (two) times daily. 60 tablet 11   No facility-administered medications prior to visit.     Allergies:   Sulfamethoxazole-trimethoprim   Social History   Socioeconomic History   Marital status: Married    Spouse name: Not on file   Number of children: Not on file   Years of education: Not on file   Highest education level: Not on file  Occupational History   Not on file  Tobacco Use   Smoking status: Every Day    Packs/day: 1.00    Types: Cigarettes   Smokeless tobacco: Never  Vaping Use   Vaping Use: Never used  Substance and Sexual Activity   Alcohol use: Yes    Comment: occasionally   Drug use: No   Sexual activity: Not on file  Other Topics Concern   Not on file  Social History Narrative   Not on file   Social Determinants of Health   Financial Resource Strain: Not on file  Food Insecurity: Not on file  Transportation Needs: Not on file  Physical Activity: Not on file  Stress: Not on file  Social Connections: Not on file     Family History:  The patient's family history is not on file.   Review of  Systems:    Please see the history of present illness.     All other systems reviewed and are otherwise negative except as noted above.   Physical Exam:    VS:  BP 138/72    Pulse 80    Ht 5\' 8"  (1.727 m)    Wt 165 lb (74.8 kg)    SpO2 99%    BMI 25.09 kg/m    General: Well developed, well nourished,male appearing in no acute distress. Head: Normocephalic, atraumatic. Neck: No carotid bruits. JVD not elevated.  Lungs: Respirations regular and unlabored,  without wheezes or rales.  Heart: Regular rate and rhythm. No S3 or S4.  No murmur, no rubs, or gallops appreciated. Abdomen: Appears non-distended. No obvious abdominal masses. Msk:  Strength and tone appear normal for age. No obvious joint deformities or effusions. Extremities: No clubbing or cyanosis. No pitting edema.  Distal pedal pulses are 2+ bilaterally. Radial site stable without ecchymosis or evidence of a hematoma.  Neuro: Alert and oriented X 3. Moves all extremities spontaneously. No focal deficits noted. Psych:  Responds to questions appropriately with a normal affect. Skin: No rashes or lesions noted  Wt Readings from Last 3 Encounters:  06/11/21 165 lb (74.8 kg)  05/13/21 156 lb 4.8 oz (70.9 kg)  05/07/21 162 lb (73.5 kg)     Studies/Labs Reviewed:   EKG:  EKG is not ordered today. EKG from 05/14/2021 is reviewed and shows NSR, HR 65 with no acute ST changes.   Recent Labs: 05/14/2021: ALT 32; BUN 9; Creatinine, Ser 0.83; Hemoglobin 13.5; Magnesium 1.8; Platelets 207; Potassium 3.5; Sodium 137   Lipid Panel    Component Value Date/Time   CHOL 115 05/14/2021 0148   TRIG 60 05/14/2021 0148   HDL 39 (L) 05/14/2021 0148   CHOLHDL 2.9 05/14/2021 0148   VLDL 12 05/14/2021 0148   LDLCALC 64 05/14/2021 0148    Additional studies/ records that were reviewed today include:   Echocardiogram: 05/2021 IMPRESSIONS     1. Mild hypokinesis of the base/mid inferior/inferoseptal walls. . Left  ventricular ejection  fraction, by estimation, is 50 to 55%. The left  ventricle has low normal function.   2. Right ventricular systolic function is normal. The right ventricular  size is normal.   3. Trivial mitral valve regurgitation.   4. The aortic valve is tricuspid. Aortic valve regurgitation is not  visualized. Aortic valve sclerosis is present, with no evidence of aortic  valve stenosis.   LHC: 05/2021 Conclusions: Severe two-vessel coronary artery disease with sequential ostial through distal LAD stenoses of up to 70% shown to be hemodynamically significant on recent CT-FFR as well as 99% stenosis of tiny D1 branch and chronic total occlusion of the mid RCA with left-right collaterals.  Moderate, nonobstructive CAD noted in the mid LCx and OM1. Normal left ventricular filling pressure (LVEDP 10 mmHg). Successful PCI to proximal and mid LAD using nonoverlapping Onyx Frontier 3.0 x 12 mm (proximal) and 2.75 x 15 mm (mid) drug-eluting stents with 0% residual stenosis and TIMI-3 flow.   Recommendations: Dual antiplatelet therapy with aspirin and ticagrelor for at least 12 months. Aggressive secondary prevention of coronary artery disease. Add isosorbide mononitrate for medical therapy of D1 and RCA disease.  Wean nitroglycerin infusion as tolerated.  Assessment:    1. Coronary artery disease of native artery of native heart with stable angina pectoris ([place])   2. Essential hypertension   3. Hyperlipidemia LDL goal <70   4. Tobacco use      Plan:   In order of problems listed above:  1. CAD - He received DESx2 to the proximal and mid-LAD during his recent catheterization but did have residual disease with CTO of mid-RCA with L-->R collaterals and 99% stenosis of tiny D1 branch with medical management recommended. - He has experienced occasional chest pain since his catheterization which has resolved with SL NTGx1 and has not been as significant as his prior symptoms. I encouraged him to make [place]  aware if symptoms continue as Imdur could be further titrated from 30 mg  daily to 60 mg daily. His EF was mildly reduced at 50-55% and he will continue with BB and ACE-I.  - Given the cost of Brilinta, will switch to Plavix with loading dose of 300 mg daily followed by 75 mg daily. Continue ASA 81 mg daily, Lopressor 12.5 mg twice daily and Atorvastatin 80 mg daily.  2. HTN - His blood pressure is at 138/72 during today's visit he reports this has been well-controlled when checked at home with SBP typically in the 120's. Continue current medication regimen for now with Amlodipine 5 mg daily, Imdur 30 mg daily, Lisinopril 10 mg daily and Lopressor 12.5 mg twice daily.  3. HLD - FLP during recent admission showed total cholesterol 115, triglycerides 60, HDL 39 and LDL 64. His Atorvastatin was titrated from 40mg  daily to 80 mg daily and he does report having upcoming labs with his PCP later this month.  He will asked them to fax [place] a copy and would aim for goal LDL less than 55. Continue Atorvastatin 80 mg daily.  4. Tobacco Use - He has reduced his tobacco use down from 4 cartons per month to 1 carton per month. Was congratulated on his reduction with cessation advised.  Medication Adjustments/Labs and Tests Ordered: Current medicines are reviewed at length with the patient today.  Concerns regarding medicines are outlined above.  Medication changes, Labs and Tests ordered today are listed in the Patient Instructions below. Patient Instructions  Medication Instructions:  Your physician has recommended you make the following change in your medication:  STOP Brilinta START Plavix 75 mg tablets- take 300mg  (4 tablets) on day 1.   *If you need a refill on your cardiac medications before your next appointment, please call your pharmacy*   Lab Work: None If you have labs (blood work) drawn today and your tests are completely normal, you will receive your results only by: [place] (if you  have MyChart) OR A paper copy in the mail If you have any lab test that is abnormal or we need to change your treatment, we will call you to review the results.   Testing/Procedures: None   Follow-Up: At [facility], you and your health needs are our priority.  As part of our continuing mission to provide you with exceptional heart care, we have created designated Provider Care Teams.  These Care Teams include your primary Cardiologist (physician) and Advanced Practice Providers (APPs -  Physician Assistants and Nurse Practitioners) who all work together to provide you with the care you need, when you need it.  We recommend signing up for the patient portal called "MyChart".  Sign up information is provided on this After Visit Summary.  MyChart is used to connect with patients for Virtual Visits (Telemedicine).  Patients are able to view lab/test results, encounter notes, upcoming appointments, etc.  Non-urgent messages can be sent to your provider as well.   To learn more about what you can do with MyChart, go to [email or web address].    Your next appointment:   3 month(s)  The format for your next appointment:   In Person  Provider:   Carlyle Dolly, MD    Other Instructions        Signed, Erma Heritage, PA-C  06/11/2021 5:00 PM    [facility]. [address] [place], [place] [address] Phone: [phone] Fax: [phone]

## 2021-06-11 ENCOUNTER — Other Ambulatory Visit: Payer: Self-pay

## 2021-06-11 ENCOUNTER — Ambulatory Visit (INDEPENDENT_AMBULATORY_CARE_PROVIDER_SITE_OTHER): Payer: Medicare Other | Admitting: Student

## 2021-06-11 ENCOUNTER — Encounter: Payer: Self-pay | Admitting: Student

## 2021-06-11 VITALS — BP 138/72 | HR 80 | Ht 68.0 in | Wt 165.0 lb

## 2021-06-11 DIAGNOSIS — I10 Essential (primary) hypertension: Secondary | ICD-10-CM

## 2021-06-11 DIAGNOSIS — Z720 Tobacco use: Secondary | ICD-10-CM

## 2021-06-11 DIAGNOSIS — E785 Hyperlipidemia, unspecified: Secondary | ICD-10-CM | POA: Diagnosis not present

## 2021-06-11 DIAGNOSIS — I25118 Atherosclerotic heart disease of native coronary artery with other forms of angina pectoris: Secondary | ICD-10-CM

## 2021-06-11 MED ORDER — CLOPIDOGREL BISULFATE 75 MG PO TABS: ORAL_TABLET | ORAL | 3 refills | Status: DC

## 2021-06-11 MED ORDER — METOPROLOL TARTRATE 25 MG PO TABS: 12.5000 mg | ORAL_TABLET | Freq: Two times a day (BID) | ORAL | 3 refills | Status: DC

## 2021-06-11 MED ORDER — ISOSORBIDE MONONITRATE ER 30 MG PO TB24: 30.0000 mg | ORAL_TABLET | Freq: Every day | ORAL | 3 refills | Status: DC

## 2021-06-11 MED ORDER — ATORVASTATIN CALCIUM 80 MG PO TABS: 80.0000 mg | ORAL_TABLET | Freq: Every day | ORAL | 3 refills | Status: DC

## 2021-06-11 NOTE — Patient Instructions (Addendum)
Medication Instructions:  Your physician has recommended you make the following change in your medication:  STOP Brilinta START Plavix 75 mg tablets- take 300mg  (4 tablets) on day 1.   *If you need a refill on your cardiac medications before your next appointment, please call your pharmacy*   Lab Work: None If you have labs (blood work) drawn today and your tests are completely normal, you will receive your results only by: [place] (if you have MyChart) OR A paper copy in the mail If you have any lab test that is abnormal or we need to change your treatment, we will call you to review the results.   Testing/Procedures: None   Follow-Up: At [facility], you and your health needs are our priority.  As part of our continuing mission to provide you with exceptional heart care, we have created designated Provider Care Teams.  These Care Teams include your primary Cardiologist (physician) and Advanced Practice Providers (APPs -  Physician Assistants and Nurse Practitioners) who all work together to provide you with the care you need, when you need it.  We recommend signing up for the patient portal called "MyChart".  Sign up information is provided on this After Visit Summary.  MyChart is used to connect with patients for Virtual Visits (Telemedicine).  Patients are able to view lab/test results, encounter notes, upcoming appointments, etc.  Non-urgent messages can be sent to your provider as well.   To learn more about what you can do with MyChart, go to [email or web address].    Your next appointment:   3 month(s)  The format for your next appointment:   In Person  Provider:   Carlyle Dolly, MD    Other Instructions

## 2021-06-17 ENCOUNTER — Encounter: Payer: Self-pay | Admitting: Student

## 2021-06-17 DIAGNOSIS — E1165 Type 2 diabetes mellitus with hyperglycemia: Secondary | ICD-10-CM | POA: Diagnosis not present

## 2021-06-17 DIAGNOSIS — Z299 Encounter for prophylactic measures, unspecified: Secondary | ICD-10-CM | POA: Diagnosis not present

## 2021-06-17 DIAGNOSIS — Z6826 Body mass index (BMI) 26.0-26.9, adult: Secondary | ICD-10-CM | POA: Diagnosis not present

## 2021-06-17 DIAGNOSIS — F17210 Nicotine dependence, cigarettes, uncomplicated: Secondary | ICD-10-CM | POA: Diagnosis not present

## 2021-06-17 DIAGNOSIS — E7800 Pure hypercholesterolemia, unspecified: Secondary | ICD-10-CM | POA: Diagnosis not present

## 2021-06-17 DIAGNOSIS — Z1339 Encounter for screening examination for other mental health and behavioral disorders: Secondary | ICD-10-CM | POA: Diagnosis not present

## 2021-06-17 DIAGNOSIS — Z7189 Other specified counseling: Secondary | ICD-10-CM | POA: Diagnosis not present

## 2021-06-17 DIAGNOSIS — I25118 Atherosclerotic heart disease of native coronary artery with other forms of angina pectoris: Secondary | ICD-10-CM | POA: Diagnosis not present

## 2021-06-17 DIAGNOSIS — Z125 Encounter for screening for malignant neoplasm of prostate: Secondary | ICD-10-CM | POA: Diagnosis not present

## 2021-06-17 DIAGNOSIS — I10 Essential (primary) hypertension: Secondary | ICD-10-CM | POA: Diagnosis not present

## 2021-06-17 DIAGNOSIS — Z79899 Other long term (current) drug therapy: Secondary | ICD-10-CM | POA: Diagnosis not present

## 2021-06-17 DIAGNOSIS — Z1331 Encounter for screening for depression: Secondary | ICD-10-CM | POA: Diagnosis not present

## 2021-06-17 DIAGNOSIS — M549 Dorsalgia, unspecified: Secondary | ICD-10-CM | POA: Diagnosis not present

## 2021-06-17 DIAGNOSIS — R5383 Other fatigue: Secondary | ICD-10-CM | POA: Diagnosis not present

## 2021-06-17 DIAGNOSIS — Z0000 Encounter for general adult medical examination without abnormal findings: Secondary | ICD-10-CM | POA: Diagnosis not present

## 2021-07-08 DIAGNOSIS — I10 Essential (primary) hypertension: Secondary | ICD-10-CM | POA: Diagnosis not present

## 2021-08-15 DIAGNOSIS — M549 Dorsalgia, unspecified: Secondary | ICD-10-CM | POA: Diagnosis not present

## 2021-08-15 DIAGNOSIS — F17210 Nicotine dependence, cigarettes, uncomplicated: Secondary | ICD-10-CM | POA: Diagnosis not present

## 2021-08-15 DIAGNOSIS — I10 Essential (primary) hypertension: Secondary | ICD-10-CM | POA: Diagnosis not present

## 2021-08-15 DIAGNOSIS — I25118 Atherosclerotic heart disease of native coronary artery with other forms of angina pectoris: Secondary | ICD-10-CM | POA: Diagnosis not present

## 2021-08-15 DIAGNOSIS — Z299 Encounter for prophylactic measures, unspecified: Secondary | ICD-10-CM | POA: Diagnosis not present

## 2021-08-15 DIAGNOSIS — Z6826 Body mass index (BMI) 26.0-26.9, adult: Secondary | ICD-10-CM | POA: Diagnosis not present

## 2021-08-23 DIAGNOSIS — M25552 Pain in left hip: Secondary | ICD-10-CM | POA: Diagnosis not present

## 2021-08-23 DIAGNOSIS — M25562 Pain in left knee: Secondary | ICD-10-CM | POA: Diagnosis not present

## 2021-08-23 DIAGNOSIS — Z5321 Procedure and treatment not carried out due to patient leaving prior to being seen by health care provider: Secondary | ICD-10-CM | POA: Diagnosis not present

## 2021-08-27 DIAGNOSIS — I10 Essential (primary) hypertension: Secondary | ICD-10-CM | POA: Diagnosis not present

## 2021-08-27 DIAGNOSIS — E1165 Type 2 diabetes mellitus with hyperglycemia: Secondary | ICD-10-CM | POA: Diagnosis not present

## 2021-08-27 DIAGNOSIS — Z299 Encounter for prophylactic measures, unspecified: Secondary | ICD-10-CM | POA: Diagnosis not present

## 2021-08-27 DIAGNOSIS — M25562 Pain in left knee: Secondary | ICD-10-CM | POA: Diagnosis not present

## 2021-08-27 DIAGNOSIS — F17210 Nicotine dependence, cigarettes, uncomplicated: Secondary | ICD-10-CM | POA: Diagnosis not present

## 2021-08-29 ENCOUNTER — Ambulatory Visit (INDEPENDENT_AMBULATORY_CARE_PROVIDER_SITE_OTHER): Payer: Medicare Other | Admitting: Orthopedic Surgery

## 2021-08-29 ENCOUNTER — Other Ambulatory Visit: Payer: Self-pay

## 2021-08-29 ENCOUNTER — Encounter: Payer: Self-pay | Admitting: Orthopedic Surgery

## 2021-08-29 DIAGNOSIS — G8929 Other chronic pain: Secondary | ICD-10-CM

## 2021-08-29 DIAGNOSIS — M25562 Pain in left knee: Secondary | ICD-10-CM

## 2021-08-29 DIAGNOSIS — I25118 Atherosclerotic heart disease of native coronary artery with other forms of angina pectoris: Secondary | ICD-10-CM

## 2021-08-29 DIAGNOSIS — M5410 Radiculopathy, site unspecified: Secondary | ICD-10-CM | POA: Diagnosis not present

## 2021-08-29 MED ORDER — PREDNISONE 10 MG PO TABS: 10.0000 mg | ORAL_TABLET | Freq: Every day | ORAL | 0 refills | Status: DC

## 2021-08-29 NOTE — Progress Notes (Signed)
? ?Office Visit Note ?  ?Patient: Jordan Lambert           ?Date of Birth: Jun 25, 1955           ?MRN: [id number] ?Visit Date: 08/29/2021 ?             ?Requested by: Monico Blitz, MD ?[address] ?[place],  [place] [address] ?PCP: Monico Blitz, MD ? ?Chief Complaint  ?Patient presents with  ? Left Leg - Pain  ? Left Knee - Pain  ? Left Hip - Pain  ? ? ? ? ?HPI: ?Patient is a 66 year old gentleman who is seen for initial evaluation of lateral left thigh and leg pain.  Patient states he fell down some steps 3 weeks ago landing on his left side.  He did go to [facility] radiographs of the hip and knee were normal.  Patient states the pain radiates from the left buttocks down to the lateral calf. ? ?Assessment & Plan: ?Visit Diagnoses:  ?1. Chronic pain of left knee   ?2. Radicular pain of left lower extremity   ? ? ?Plan: Will send in a prescription for prednisone for his radicular symptoms at follow-up 2 view radiographs of the lumbar spine may require an MRI scan. ? ?Follow-Up Instructions: Return in about 3 weeks (around 09/19/2021).  ? ?Ortho Exam ? ?Patient is alert, oriented, no adenopathy, well-dressed, normal affect, normal respiratory effort. ?Examination patient has difficulty getting from a sitting to a standing position.  He has a positive straight leg raise on the left motor strength is symmetric with no focal motor weakness.  Patient states he is currently on oral Dilaudid for pain. ? ?Imaging: ?No results found. ?No images are attached to the encounter. ? ?Labs: ?Lab Results  ?Component Value Date  ? HGBA1C 6.0 (H) 01/21/2017  ? ? ? ?Lab Results  ?Component Value Date  ? ALBUMIN 3.4 (L) 05/14/2021  ? ALBUMIN 3.7 05/13/2021  ? ? ?Lab Results  ?Component Value Date  ? MG 1.8 05/14/2021  ? MG 2.0 05/13/2021  ? MG 2.0 05/12/2021  ? ?No results found for: VD25OH ? ?No results found for: PREALBUMIN ? ?  Latest Ref Rng & Units 05/14/2021  ?  1:48 AM 05/13/2021  ?  2:00 AM 05/12/2021  ?  3:22 AM  ?CBC EXTENDED  ?WBC 4.0 -  10.5 K/uL 10.8   10.8   9.8    ?RBC 4.22 - 5.81 MIL/uL 4.35   4.65   4.71    ?Hemoglobin 13.0 - 17.0 g/dL 13.5   14.4   14.7    ?HCT 39.0 - 52.0 % 39.9   42.9   42.7    ?Platelets 150 - 400 K/uL 207   212   204    ?NEUT# 1.7 - 7.7 K/uL 7.0   6.4     ?Lymph# 0.7 - 4.0 K/uL 2.4   3.1     ? ? ? ?There is no height or weight on file to calculate BMI. ? ?Orders:  ?No orders of the defined types were placed in this encounter. ? ?Meds ordered this encounter  ?Medications  ? predniSONE (DELTASONE) 10 MG tablet  ?  Sig: Take 1 tablet (10 mg total) by mouth daily with breakfast.  ?  Dispense:  30 tablet  ?  Refill:  0  ? ? ? Procedures: ?No procedures performed ? ?Clinical Data: ?No additional findings. ? ?ROS: ? ?All other systems negative, except as noted in the HPI. ?Review of  Systems ? ?Objective: ?Vital Signs: There were no vitals taken for this visit. ? ?Specialty Comments:  ?No specialty comments available. ? ?PMFS History: ?Patient Active Problem List  ? Diagnosis Date Noted  ? Malnutrition of moderate degree 05/14/2021  ? Tobacco abuse   ? Hyperlipidemia with target LDL less than 70   ? Hypertension   ? Angina pectoris ([place]) 05/11/2021  ? Unstable angina ([place]) 05/11/2021  ? Cervical vertebral fusion 02/20/2017  ? ?Past Medical History:  ?Diagnosis Date  ? Arthritis   ? CAD (coronary artery disease)   ? a. s/p cath in 05/2021 which showed CTO of RCA with L-->R collaterals, 70% stenosis along LAD which was positive by FFR and 99% stenosis of small D1. Received DESx2 to proximal and mid-LAD with med management recommended of RCA and D1  ? Diabetes mellitus without complication ([place])   ? not on meds  ? Hypertension   ? has been taken off bp meds   ? Nerve damage   ? nerve left hip down to left foot  ? Sleep apnea   ? does not use cpap  ?  ?History reviewed. No pertinent family history.  ?Past Surgical History:  ?Procedure Laterality Date  ? ANTERIOR CERVICAL DECOMP/DISCECTOMY FUSION N/A 02/20/2017  ? Procedure: CERVICAL  THREE-FOUR ANTERIOR CERVICAL DECOMPRESSION/DISCECTOMY FUSION;  Surgeon: Eustace Moore, MD;  Location: [facility];  Service: Neurosurgery;  Laterality: N/A;  ? APPENDECTOMY    ? BACK SURGERY    ? CORONARY STENT INTERVENTION N/A 05/13/2021  ? Procedure: CORONARY STENT INTERVENTION;  Surgeon: Nelva Bush, MD;  Location: [facility];  Service: Cardiovascular;  Laterality: N/A;  ? HERNIA REPAIR    ? LEFT HEART CATH AND CORONARY ANGIOGRAPHY N/A 05/13/2021  ? Procedure: LEFT HEART CATH AND CORONARY ANGIOGRAPHY;  Surgeon: Nelva Bush, MD;  Location: [facility];  Service: Cardiovascular;  Laterality: N/A;  ? neck fusion    ? nerve stimulor placed    ? sciatica    ? ?Social History  ? ?Occupational History  ? Not on file  ?Tobacco Use  ? Smoking status: Every Day  ?  Packs/day: 1.00  ?  Types: Cigarettes  ? Smokeless tobacco: Never  ?Vaping Use  ? Vaping Use: Never used  ?Substance and Sexual Activity  ? Alcohol use: Yes  ?  Comment: occasionally  ? Drug use: No  ? Sexual activity: Not on file  ? ? ? ? ? ?

## 2021-09-16 ENCOUNTER — Encounter: Payer: Self-pay | Admitting: Cardiology

## 2021-09-16 ENCOUNTER — Ambulatory Visit (INDEPENDENT_AMBULATORY_CARE_PROVIDER_SITE_OTHER): Payer: Medicare Other | Admitting: Cardiology

## 2021-09-16 VITALS — BP 108/68 | HR 62 | Ht 68.0 in | Wt 166.0 lb

## 2021-09-16 DIAGNOSIS — I10 Essential (primary) hypertension: Secondary | ICD-10-CM | POA: Diagnosis not present

## 2021-09-16 DIAGNOSIS — E782 Mixed hyperlipidemia: Secondary | ICD-10-CM | POA: Diagnosis not present

## 2021-09-16 DIAGNOSIS — I25118 Atherosclerotic heart disease of native coronary artery with other forms of angina pectoris: Secondary | ICD-10-CM
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
## 2021-09-16 NOTE — Patient Instructions (Signed)
Medication Instructions:  ?Continue all current medications. ? ?Labwork: ?none ? ?Testing/Procedures: ?none ? ?Follow-Up: ?November 2023  ? ?Any Other Special Instructions Will Be Listed Below (If Applicable). ? ? ?If you need a refill on your cardiac medications before your next appointment, please call your pharmacy. ? ?

## 2021-09-19 ENCOUNTER — Ambulatory Visit (INDEPENDENT_AMBULATORY_CARE_PROVIDER_SITE_OTHER): Payer: Medicare Other | Admitting: Orthopedic Surgery

## 2021-09-19 DIAGNOSIS — I25118 Atherosclerotic heart disease of native coronary artery with other forms of angina pectoris: Secondary | ICD-10-CM

## 2021-09-19 DIAGNOSIS — M5410 Radiculopathy, site unspecified: Secondary | ICD-10-CM

## 2021-09-24 ENCOUNTER — Encounter: Payer: Self-pay | Admitting: Orthopedic Surgery

## 2021-09-24 NOTE — Progress Notes (Signed)
? ?Office Visit Note ?  ?Patient: Jordan Lambert           ?Date of Birth: 1955/09/24           ?MRN: [id number] ?Visit Date: 09/19/2021 ?             ?Requested by: Monico Blitz, MD ?[address] ?[place],  [place] [address] ?PCP: Monico Blitz, MD ? ?Chief Complaint  ?Patient presents with  ? Left Leg - Pain  ?  Rad s/s also injury 1 week ago to left foot   ? ? ? ? ?HPI: ?Patient is a 66 year old gentleman who is seen for evaluation for 2 separate issues.  Patient states he still has continued lower extremity radicular pain down the left side he states the prednisone has taken the edge off but not much better.  Patient complains of pain radiating from the left buttocks to the iliotibial band insertion. ? ?Patient is status post lumbar spine fusion through the entire lower lumbar spine.  Patient states he has a nerve stimulator that was placed for the left-sided radicular symptoms. ? ?Patient states that he injured his left foot and ankle last Thursday with some bruising on the medial side swelling and tenderness.  He states that he dropped a well house cover on the foot.  He takes a baby aspirin a day.  He states that this struck his medial malleolus. ? ?Assessment & Plan: ?Visit Diagnoses:  ?1. Radicular pain of left lower extremity   ? ? ?Plan: Plan: We will obtain an MRI scan of his lumbar spine anticipate patient will need to follow-up with neurosurgery for the radicular symptoms.  Will reevaluate the foot but anticipate this should resolve with time. ? ?Follow-Up Instructions: Return in about 4 weeks (around 10/17/2021).  ? ?Ortho Exam ? ?Patient is alert, oriented, no adenopathy, well-dressed, normal affect, normal respiratory effort. ?Patient states that the left-sided radicular symptoms are present all the time he states his pain is an 8 out of 10 he states his leg feels weak.  There is no knee effusion no knee pain with range of motion.  Patient has bruising over the medial malleolus there is no tenderness to  palpation there is some ecchymosis and bruising that goes down into the plantar aspect of the foot no clinical signs of instability or fractures.  He is on Plavix which explains the bleeding.  Patient has a negative straight leg raise on the left there is no focal gross motor weakness. ? ?Imaging: ?No results found. ?No images are attached to the encounter. ? ?Labs: ?Lab Results  ?Component Value Date  ? HGBA1C 6.0 (H) 01/21/2017  ? ? ? ?Lab Results  ?Component Value Date  ? ALBUMIN 3.4 (L) 05/14/2021  ? ALBUMIN 3.7 05/13/2021  ? ? ?Lab Results  ?Component Value Date  ? MG 1.8 05/14/2021  ? MG 2.0 05/13/2021  ? MG 2.0 05/12/2021  ? ?No results found for: VD25OH ? ?No results found for: PREALBUMIN ? ?  Latest Ref Rng & Units 05/14/2021  ?  1:48 AM 05/13/2021  ?  2:00 AM 05/12/2021  ?  3:22 AM  ?CBC EXTENDED  ?WBC 4.0 - 10.5 K/uL 10.8   10.8   9.8    ?RBC 4.22 - 5.81 MIL/uL 4.35   4.65   4.71    ?Hemoglobin 13.0 - 17.0 g/dL 13.5   14.4   14.7    ?HCT 39.0 - 52.0 % 39.9   42.9   42.7    ?  Platelets 150 - 400 K/uL 207   212   204    ?NEUT# 1.7 - 7.7 K/uL 7.0   6.4     ?Lymph# 0.7 - 4.0 K/uL 2.4   3.1     ? ? ? ?There is no height or weight on file to calculate BMI. ? ?Orders:  ?Orders Placed This Encounter  ?Procedures  ? MR Lumbar Spine w/o contrast  ? ?No orders of the defined types were placed in this encounter. ? ? ? Procedures: ?No procedures performed ? ?Clinical Data: ?No additional findings. ? ?ROS: ? ?All other systems negative, except as noted in the HPI. ?Review of Systems ? ?Objective: ?Vital Signs: There were no vitals taken for this visit. ? ?Specialty Comments:  ?No specialty comments available. ? ?PMFS History: ?Patient Active Problem List  ? Diagnosis Date Noted  ? Malnutrition of moderate degree 05/14/2021  ? Tobacco abuse   ? Hyperlipidemia with target LDL less than 70   ? Hypertension   ? Angina pectoris ([place]) 05/11/2021  ? Unstable angina ([place]) 05/11/2021  ? Cervical vertebral fusion 02/20/2017  ? ?Past  Medical History:  ?Diagnosis Date  ? Arthritis   ? CAD (coronary artery disease)   ? a. s/p cath in 05/2021 which showed CTO of RCA with L-->R collaterals, 70% stenosis along LAD which was positive by FFR and 99% stenosis of small D1. Received DESx2 to proximal and mid-LAD with med management recommended of RCA and D1  ? Diabetes mellitus without complication ([place])   ? not on meds  ? Hypertension   ? has been taken off bp meds   ? Nerve damage   ? nerve left hip down to left foot  ? Sleep apnea   ? does not use cpap  ?  ?History reviewed. No pertinent family history.  ?Past Surgical History:  ?Procedure Laterality Date  ? ANTERIOR CERVICAL DECOMP/DISCECTOMY FUSION N/A 02/20/2017  ? Procedure: CERVICAL THREE-FOUR ANTERIOR CERVICAL DECOMPRESSION/DISCECTOMY FUSION;  Surgeon: Eustace Moore, MD;  Location: [place];  Service: Neurosurgery;  Laterality: N/A;  ? APPENDECTOMY    ? BACK SURGERY    ? CORONARY STENT INTERVENTION N/A 05/13/2021  ? Procedure: CORONARY STENT INTERVENTION;  Surgeon: Nelva Bush, MD;  Location: [place] [facility];  Service: Cardiovascular;  Laterality: N/A;  ? HERNIA REPAIR    ? LEFT HEART CATH AND CORONARY ANGIOGRAPHY N/A 05/13/2021  ? Procedure: LEFT HEART CATH AND CORONARY ANGIOGRAPHY;  Surgeon: Nelva Bush, MD;  Location: [place] [facility];  Service: Cardiovascular;  Laterality: N/A;  ? neck fusion    ? nerve stimulor placed    ? sciatica    ? ?Social History  ? ?Occupational History  ? Not on file  ?Tobacco Use  ? Smoking status: Every Day  ?  Packs/day: 1.00  ?  Types: Cigarettes  ? Smokeless tobacco: Never  ?Vaping Use  ? Vaping Use: Never used  ?Substance and Sexual Activity  ? Alcohol use: Yes  ?  Comment: occasionally  ? Drug use: No  ? Sexual activity: Not on file  ? ? ? ? ? ?

## 2021-10-06 DIAGNOSIS — E7800 Pure hypercholesterolemia, unspecified: Secondary | ICD-10-CM | POA: Diagnosis not present

## 2021-10-06 DIAGNOSIS — I10 Essential (primary) hypertension: Secondary | ICD-10-CM | POA: Diagnosis not present

## 2021-10-10 ENCOUNTER — Ambulatory Visit
Admission: RE | Admit: 2021-10-10 | Discharge: 2021-10-10 | Disposition: A | Discharge status: Home / Self Care | Payer: Medicare Other | Source: Ambulatory Visit | Attending: Orthopedic Surgery | Admitting: Orthopedic Surgery

## 2021-10-10 DIAGNOSIS — M545 Low back pain, unspecified: Secondary | ICD-10-CM | POA: Diagnosis not present

## 2021-10-10 DIAGNOSIS — M48061 Spinal stenosis, lumbar region without neurogenic claudication: Secondary | ICD-10-CM | POA: Diagnosis not present

## 2021-10-10 DIAGNOSIS — M5410 Radiculopathy, site unspecified: Secondary | ICD-10-CM

## 2021-10-14 ENCOUNTER — Other Ambulatory Visit: Payer: Self-pay

## 2021-10-14 DIAGNOSIS — M5410 Radiculopathy, site unspecified: Secondary | ICD-10-CM

## 2021-10-15 ENCOUNTER — Telehealth: Payer: Self-pay | Admitting: Orthopedic Surgery

## 2021-10-15 NOTE — Telephone Encounter (Signed)
Pt called asking for a call back from [place]. Pt states [place] called his wife and he would like a call back. Please call pt at [phone]. ?

## 2021-10-15 NOTE — Telephone Encounter (Signed)
I SW pt, it was on his MRI report. Dr. Sharol Given wants him to f/u with Dr. Ernestina Patches for an [facility]. He is aware FN assistant will contact for an appt. ?

## 2021-10-17 ENCOUNTER — Ambulatory Visit: Payer: Medicare Other | Admitting: Orthopedic Surgery

## 2021-10-22 ENCOUNTER — Telehealth: Payer: Self-pay | Admitting: Physical Medicine and Rehabilitation

## 2021-10-22 NOTE — Telephone Encounter (Signed)
Called pt to get sch with Dr.Newton. LVM  ?

## 2021-10-22 NOTE — Telephone Encounter (Signed)
Patient  called needing to schedule an appointment with Dr. Ernestina Patches. Patient said he was referred by Dr. Sharol Given. The number to contact patient is [phone] ?

## 2021-10-24 ENCOUNTER — Encounter: Payer: Self-pay | Admitting: Physical Medicine and Rehabilitation

## 2021-10-24 ENCOUNTER — Ambulatory Visit (INDEPENDENT_AMBULATORY_CARE_PROVIDER_SITE_OTHER): Payer: Medicare Other | Admitting: Physical Medicine and Rehabilitation

## 2021-10-24 VITALS — BP 122/68 | HR 60

## 2021-10-24 DIAGNOSIS — M4726 Other spondylosis with radiculopathy, lumbar region: Secondary | ICD-10-CM | POA: Diagnosis not present

## 2021-10-24 DIAGNOSIS — I25118 Atherosclerotic heart disease of native coronary artery with other forms of angina pectoris: Secondary | ICD-10-CM

## 2021-10-24 DIAGNOSIS — M5416 Radiculopathy, lumbar region: Secondary | ICD-10-CM | POA: Diagnosis not present

## 2021-10-24 DIAGNOSIS — G894 Chronic pain syndrome: Secondary | ICD-10-CM | POA: Diagnosis not present

## 2021-10-24 DIAGNOSIS — M48061 Spinal stenosis, lumbar region without neurogenic claudication: Secondary | ICD-10-CM

## 2021-10-24 NOTE — Progress Notes (Signed)
Pt state lower back pain that travels down his left leg and foot. Pt state walking, standing and bending makes the pain worse. Pt state he takes over counter pain meds and and ice to help ease his pain.  Numeric Pain Rating Scale and Functional Assessment Average Pain 10 Pain Right Now 8 My pain is constant, sharp, burning, stabbing, and aching Pain is worse with: walking, bending, sitting, standing, and some activites Pain improves with: heat/ice and medication   In the last MONTH (on 0-10 scale) has pain interfered with the following?  1. General activity like being  able to carry out your everyday physical activities such as walking, climbing stairs, carrying groceries, or moving a chair?  Rating(5)  2. Relation with others like being able to carry out your usual social activities and roles such as  activities at home, at work and in your community. Rating(6)  3. Enjoyment of life such that you have  been bothered by emotional problems such as feeling anxious, depressed or irritable?  Rating(7)

## 2021-10-24 NOTE — Progress Notes (Signed)
[name] - 66 y.o. male MRN [id number]  Date of birth: [birth year]  Office Visit Note: Visit Date: 10/24/2021 PCP: Monico Blitz, MD Referred by: Monico Blitz, MD  Subjective: Chief Complaint  Patient presents with   Lower Back - Pain   Left Leg - Pain   Left Foot - Pain   HPI: Jordan Lambert a 66 y.o. male who comes in today at the request of Dr. Meridee Score for evaluation of chronic, worsening and severe left sided lower back pain radiating to buttock and down posterolateral leg to foot. Patient reports pain has been ongoing for several years. Patient states his pain started after accident in 2008 where he fell out of tree. Patient states pain Lambert exacerbated by movement and activity, describes as sore, aching and burning sensation, currently rates as 7 out of 10. Patient reports some relief of pain with home exercise regimen, rest, ice packs, and over the counter medications as needed. Patient has attended formal physical therapy at [facility] several years ago, he reports no relief of pain with these treatments.  Patient's recent lumbar MRI exhibits multilevel facet hypertrophy, worsening left lateral recess stenosis as a result of worsening protruding disc material at the level of L4-L5.  There Lambert potential for compression of the left L4 and L5 nerves.  No high-grade spinal canal stenosis noted.  Patient was previously managed by Dr. Dorene Ar for chronic back issues and chronic pain, he did have multiple lumbar injections performed in his office including radiofrequency ablation and transforaminal epidural steroid injection.  Patient reports minimal relief of pain with these procedures.  Patient did have [place] spinal cord stimulator placed by Dr. Glenna Fellows in 2014, recently underwent surgical procedure to have battery changed by Dr. Francesco Runner in June of 2022. Patient has history of C3-C4 ACDF performed by Dr. Sherley Bounds and T11 kyphoplasty by Francesco Runner in 2020. Patient  states he continues to have chronic pain and feels procedures/interventions are not helping. States he Lambert not taking Dilaudid at this time as he feels this medication did not alleviate his pain. Patient denies focal weakness, numbness and tingling. Patient denies recent trauma or falls.   Oswestry Disability Index Score 38% 10 to 20 (40%) moderate disability: The patient experiences more pain and difficulty with sitting, lifting and standing. Travel and social life are more difficult, and they may be disabled from work. Personal care, sexual activity and sleeping are not grossly affected, and the patient can usually be managed by conservative means.   Review of Systems  Musculoskeletal:  Positive for back pain.  Neurological:  Negative for tingling, sensory change, focal weakness and weakness.  All other systems reviewed and are negative. Otherwise per HPI.  Assessment & Plan: Visit Diagnoses:    ICD-10-CM   1. Lumbar radiculopathy  M54.16 Ambulatory referral to Physical Medicine Rehab    2. Other spondylosis with radiculopathy, lumbar region  M47.26 Ambulatory referral to Physical Medicine Rehab    3. Stenosis of lateral recess of lumbar spine  M48.061 Ambulatory referral to Physical Medicine Rehab    4. Chronic pain syndrome  G89.4        Plan: Findings:  Chronic, worsening and severe left-sided lower back pain radiating to buttock and left posterolateral leg down to foot.  Patient continues to have severe pain despite good conservative therapy such as formal physical therapy, home exercise regimen and use of medications.  Patient's clinical presentation and exam are consistent with L5 nerve  pattern. There Lambert worsening lateral recess stenosis on the left at L4-L5 due to worsening protruding disc material.  We believe the neck step Lambert to perform a diagnostic and hopefully therapeutic left L5 transforaminal epidural steroid injection under fluoroscopic guidance.  He does really want to try the  injection as he has had them in the past and he has had some results but were good from those.  He basically came to our office instead of Dr. Francesco Runner as he said he really did not want to undergo opioid management anymore and was looking to see if there Lambert anything else that would help.  He could be a candidate for laminectomy decompression.  He already has a spinal cord stimulator and we will contact [facility] to see about his history with this stimulator and could possibly look at reprogramming if has not been done recently.   Meds & Orders: No orders of the defined types were placed in this encounter.   Orders Placed This Encounter  Procedures   Ambulatory referral to Physical Medicine Rehab    Follow-up: Return for Left L5 transforaminal epidural steroid injection.   Procedures: No procedures performed      Clinical History: EXAM: MRI LUMBAR SPINE WITHOUT CONTRAST   TECHNIQUE: Multiplanar, multisequence MR imaging of the lumbar spine was performed. No intravenous contrast was administered.   COMPARISON:  Lumbar radiography 12/12/2016 and MRI 12/07/2012   FINDINGS: Segmentation: 5 lumbar type vertebral bodies as numbered previously.   Alignment:  No malalignment.   Vertebrae: Old augmented fracture at T11, not primarily or completely evaluated. No unexpected finding as seen.   Conus medullaris and cauda equina: Conus extends to the L1 level. Conus and cauda equina appear normal.   Paraspinal and other soft tissues: Negative   Disc levels:   T12-L1: Normal   L1-2: Desiccation and mild bulging of the disc. Slight indentation of the thecal sac but no likely compressive stenosis.   L2-3: Mild bulging of the disc.  No compressive stenosis.   L3-4: Minimal bulging of the disc. Mild facet and ligamentous hypertrophy. Mild stenosis of both lateral recesses and neural foramina but without visible neural compression.   L4-5: Degeneration and bulging of the disc more  prominent towards the left. Facet and ligamentous hypertrophy. Stenosis of the lateral recesses left more than right. Proximal foraminal narrowing on the left. Some potential for neural compression on the left at this level.   L5-S1: Mild bulging of the disc.  No compressive stenosis.   Note that the right L5 and S1 root sleeves appear conjoined.   Compared to the study of 2014, the left-sided findings at L4-5 have worsened and are probably the most likely to relate to the acute pain syndrome.   IMPRESSION: Worsening of left lateral recess and proximal foraminal stenosis at L4-5 due to worsening of protruding disc material with potential for compression of the left L4 and L5 nerves.   Chronic degenerative changes at the other levels appear quite similar to the study of 2014, without likely neural compression.     Electronically Signed   By: Nelson Chimes M.D.   On: 10/10/2021 17:12   He reports that he has been smoking cigarettes. He has been smoking an average of 1 pack per day. He has never used smokeless tobacco. No results for input(s): HGBA1C, LABURIC in the last 8760 hours.  Objective:  VS:  HT:    WT:   BMI:     BP:122/68  HR:60bpm  TEMP: ( )  RESP:  Physical Exam Vitals and nursing note reviewed.  HENT:     Head: Normocephalic and atraumatic.     Right Ear: External ear normal.     Left Ear: External ear normal.     Nose: Nose normal.     Mouth/Throat:     Mouth: Mucous membranes are moist.  Eyes:     Extraocular Movements: Extraocular movements intact.  Cardiovascular:     Rate and Rhythm: Normal rate.     Pulses: Normal pulses.  Pulmonary:     Effort: Pulmonary effort Lambert normal.  Abdominal:     General: Abdomen Lambert flat. There Lambert no distension.  Musculoskeletal:        General: Tenderness present.     Cervical back: Normal range of motion.     Comments: Pt rises from seated position to standing without difficulty. Good lumbar range of motion. Strong  distal strength without clonus, no pain upon palpation of greater trochanters. Dysesthesias noted to left L5 dermatome. Sensation intact bilaterally. Walks independently, gait steady.   Skin:    General: Skin Lambert warm and dry.     Capillary Refill: Capillary refill takes less than 2 seconds.  Neurological:     General: No focal deficit present.     Mental Status: He Lambert alert and oriented to person, place, and time.  Psychiatric:        Mood and Affect: Mood normal.        Behavior: Behavior normal.    Ortho Exam  Imaging: No results found.  Past Medical/Family/Surgical/Social History: Medications & Allergies reviewed per EMR, new medications updated. Patient Active Problem List   Diagnosis Date Noted   Malnutrition of moderate degree 05/14/2021   Tobacco abuse    Hyperlipidemia with target LDL less than 70    Hypertension    Angina pectoris ([place]) 05/11/2021   Unstable angina ([place]) 05/11/2021   Cervical vertebral fusion 02/20/2017   Past Medical History:  Diagnosis Date   Arthritis    CAD (coronary artery disease)    a. s/p cath in 05/2021 which showed CTO of RCA with L-->R collaterals, 70% stenosis along LAD which was positive by FFR and 99% stenosis of small D1. Received DESx2 to proximal and mid-LAD with med management recommended of RCA and D1   Diabetes mellitus without complication ([place])    not on meds   Hypertension    has been taken off bp meds    Nerve damage    nerve left hip down to left foot   Sleep apnea    does not use cpap   History reviewed. No pertinent family history. Past Surgical History:  Procedure Laterality Date   ANTERIOR CERVICAL DECOMP/DISCECTOMY FUSION N/A 02/20/2017   Procedure: CERVICAL THREE-FOUR ANTERIOR CERVICAL DECOMPRESSION/DISCECTOMY FUSION;  Surgeon: Eustace Moore, MD;  Location: [facility];  Service: Neurosurgery;  Laterality: N/A;   APPENDECTOMY     BACK SURGERY     CORONARY STENT INTERVENTION N/A 05/13/2021   Procedure: CORONARY STENT  INTERVENTION;  Surgeon: Nelva Bush, MD;  Location: [facility];  Service: Cardiovascular;  Laterality: N/A;   HERNIA REPAIR     LEFT HEART CATH AND CORONARY ANGIOGRAPHY N/A 05/13/2021   Procedure: LEFT HEART CATH AND CORONARY ANGIOGRAPHY;  Surgeon: Nelva Bush, MD;  Location: [facility];  Service: Cardiovascular;  Laterality: N/A;   neck fusion     nerve stimulor placed     sciatica  Social History   Occupational History   Not on file  Tobacco Use   Smoking status: Every Day    Packs/day: 1.00    Types: Cigarettes   Smokeless tobacco: Never  Vaping Use   Vaping Use: Never used  Substance and Sexual Activity   Alcohol use: Yes    Comment: occasionally   Drug use: No   Sexual activity: Not on file

## 2021-11-18 ENCOUNTER — Telehealth: Payer: Self-pay | Admitting: Physical Medicine and Rehabilitation

## 2021-11-18 ENCOUNTER — Encounter: Payer: Medicare Other | Admitting: Physical Medicine and Rehabilitation

## 2021-11-18 NOTE — Telephone Encounter (Signed)
Patient called needing to R/S his appointment due to Covid-19 virus.  Patient asked for call back.   Ph# [phone]

## 2021-12-06 DIAGNOSIS — E7800 Pure hypercholesterolemia, unspecified: Secondary | ICD-10-CM | POA: Diagnosis not present

## 2021-12-06 DIAGNOSIS — I10 Essential (primary) hypertension: Secondary | ICD-10-CM | POA: Diagnosis not present

## 2021-12-16 DIAGNOSIS — F17210 Nicotine dependence, cigarettes, uncomplicated: Secondary | ICD-10-CM | POA: Diagnosis not present

## 2021-12-16 DIAGNOSIS — E440 Moderate protein-calorie malnutrition: Secondary | ICD-10-CM | POA: Diagnosis not present

## 2021-12-16 DIAGNOSIS — Z6824 Body mass index (BMI) 24.0-24.9, adult: Secondary | ICD-10-CM | POA: Diagnosis not present

## 2021-12-16 DIAGNOSIS — Z299 Encounter for prophylactic measures, unspecified: Secondary | ICD-10-CM | POA: Diagnosis not present

## 2021-12-16 DIAGNOSIS — M79606 Pain in leg, unspecified: Secondary | ICD-10-CM | POA: Diagnosis not present

## 2021-12-16 DIAGNOSIS — M542 Cervicalgia: Secondary | ICD-10-CM | POA: Diagnosis not present

## 2021-12-16 DIAGNOSIS — I10 Essential (primary) hypertension: Secondary | ICD-10-CM | POA: Diagnosis not present

## 2021-12-16 DIAGNOSIS — E1165 Type 2 diabetes mellitus with hyperglycemia: Secondary | ICD-10-CM | POA: Diagnosis not present

## 2021-12-18 ENCOUNTER — Telehealth: Payer: Self-pay | Admitting: Physical Medicine and Rehabilitation

## 2021-12-18 NOTE — Telephone Encounter (Signed)
Patient called needing to Cx his appointment. Patient said he can get to his appointment but, do not have a ride back home. Patient said he will try to call back to schedule when he has transportation.    Ph# [phone]

## 2021-12-19 ENCOUNTER — Ambulatory Visit: Payer: Self-pay

## 2021-12-19 ENCOUNTER — Ambulatory Visit (INDEPENDENT_AMBULATORY_CARE_PROVIDER_SITE_OTHER): Payer: Medicare Other | Admitting: Physical Medicine and Rehabilitation

## 2021-12-19 ENCOUNTER — Encounter: Payer: Self-pay | Admitting: Physical Medicine and Rehabilitation

## 2021-12-19 VITALS — BP 105/67 | HR 73

## 2021-12-19 DIAGNOSIS — M5416 Radiculopathy, lumbar region: Secondary | ICD-10-CM

## 2021-12-19 MED ORDER — BUPIVACAINE HCL 0.5 % IJ SOLN: 3.0000 mL | Freq: Once | INTRAMUSCULAR | Status: AC

## 2021-12-19 MED ORDER — METHYLPREDNISOLONE ACETATE 80 MG/ML IJ SUSP
80.0000 mg | Freq: Once | INTRAMUSCULAR | Status: AC
  Administered 2021-12-19: 80 mg

## 2021-12-19 NOTE — Progress Notes (Signed)
Pt state lower back pain that travels down his left leg and foot. Pt state walking, standing and bending makes the pain worse. Pt state he takes over counter pain meds and and ice to help ease his pain.  Numeric Pain Rating Scale and Functional Assessment Average Pain 8   In the last MONTH (on 0-10 scale) has pain interfered with the following?  1. General activity like being  able to carry out your everyday physical activities such as walking, climbing stairs, carrying groceries, or moving a chair?  Rating(9)   +Driver, -BT, -Dye Allergies.

## 2021-12-19 NOTE — Patient Instructions (Signed)

## 2021-12-23 DIAGNOSIS — I739 Peripheral vascular disease, unspecified: Secondary | ICD-10-CM | POA: Diagnosis not present

## 2021-12-23 DIAGNOSIS — I700 Atherosclerosis of aorta: Secondary | ICD-10-CM | POA: Diagnosis not present

## 2021-12-27 DIAGNOSIS — N35819 Other urethral stricture, male, unspecified site: Secondary | ICD-10-CM | POA: Diagnosis not present

## 2021-12-27 DIAGNOSIS — N401 Enlarged prostate with lower urinary tract symptoms: Secondary | ICD-10-CM | POA: Diagnosis not present

## 2021-12-27 DIAGNOSIS — N138 Other obstructive and reflux uropathy: Secondary | ICD-10-CM | POA: Diagnosis not present

## 2022-01-06 NOTE — Procedures (Signed)
Lumbosacral Transforaminal Epidural Steroid Injection - Sub-Pedicular Approach with Fluoroscopic Guidance  Patient: Jordan Lambert      Date of Birth: [birth year] MRN: [id number] PCP: Monico Blitz, MD      Visit Date: 12/19/2021   Universal Protocol:    Date/Time: 12/19/2021  Consent Given By: the patient  Position: PRONE  Additional Comments: Vital signs were monitored before and after the procedure. Patient was prepped and draped in the usual sterile fashion. The correct patient, procedure, and site was verified.   Injection Procedure Details:   Procedure diagnoses: Lumbar radiculopathy [M54.16]    Meds Administered:  Meds ordered this encounter  Medications   bupivacaine (MARCAINE) 0.5 % (with pres) injection 3 mL   methylPREDNISolone acetate (DEPO-MEDROL) injection 80 mg    Laterality: Left  Location/Site: L5  Needle:5.0 in., 22 ga.  Short bevel or Quincke spinal needle  Needle Placement: Transforaminal  Findings:    -Comments: Excellent flow of contrast along the nerve, nerve root and into the epidural space.  Procedure Details: After squaring off the end-plates to get a true AP view, the C-arm was positioned so that an oblique view of the foramen as noted above was visualized. The target area is just inferior to the "nose of the scotty dog" or sub pedicular. The soft tissues overlying this structure were infiltrated with 2-3 ml. of 1% Lidocaine without Epinephrine.  The spinal needle was inserted toward the target using a "trajectory" view along the fluoroscope beam.  Under AP and lateral visualization, the needle was advanced so it did not puncture dura and was located close the 6 O'Clock position of the pedical in AP tracterory. Biplanar projections were used to confirm position. Aspiration was confirmed to be negative for CSF and/or blood. A 1-2 ml. volume of Isovue-250 was injected and flow of contrast was noted at each level. Radiographs were obtained for  documentation purposes.   After attaining the desired flow of contrast documented above, a 0.5 to 1.0 ml test dose of 0.25% Marcaine was injected into each respective transforaminal space.  The patient was observed for 90 seconds post injection.  After no sensory deficits were reported, and normal lower extremity motor function was noted,   the above injectate was administered so that equal amounts of the injectate were placed at each foramen (level) into the transforaminal epidural space.   Additional Comments:  The patient tolerated the procedure well Dressing: 2 x 2 sterile gauze and Band-Aid    Post-procedure details: Patient was observed during the procedure. Post-procedure instructions were reviewed.  Patient left the clinic in stable condition.

## 2022-01-06 NOTE — Progress Notes (Signed)
Jordan Lambert - 65 y.o. male MRN [id number]  Date of birth: July 14, 1955  Office Visit Note: Visit Date: 12/19/2021 PCP: Monico Blitz, MD Referred by: Monico Blitz, MD  Subjective: Chief Complaint  Patient presents with   Lower Back - Pain   Left Leg - Pain   Left Foot - Pain   HPI:  Jordan Lambert is a 66 y.o. male who comes in today at the request of Barnet Pall, FNP for planned Left L5-S1 Lumbar Transforaminal epidural steroid injection with fluoroscopic guidance.  The patient has failed conservative care including home exercise, medications, time and activity modification.  This injection will be diagnostic and hopefully therapeutic.  Please see requesting physician notes for further details and justification.    ROS Otherwise per HPI.  Assessment & Plan: Visit Diagnoses:    ICD-10-CM   1. Lumbar radiculopathy  M54.16 XR C-ARM NO REPORT    Facet Injection    bupivacaine (MARCAINE) 0.5 % (with pres) injection 3 mL    methylPREDNISolone acetate (DEPO-MEDROL) injection 80 mg      Plan: No additional findings.   Meds & Orders:  Meds ordered this encounter  Medications   bupivacaine (MARCAINE) 0.5 % (with pres) injection 3 mL   methylPREDNISolone acetate (DEPO-MEDROL) injection 80 mg    Orders Placed This Encounter  Procedures   Facet Injection   XR C-ARM NO REPORT    Follow-up: Return if symptoms worsen or fail to improve.   Procedures: No procedures performed  Lumbosacral Transforaminal Epidural Steroid Injection - Sub-Pedicular Approach with Fluoroscopic Guidance  Patient: Jordan Lambert      Date of Birth: [birth year] MRN: [id number] PCP: Monico Blitz, MD      Visit Date: 12/19/2021   Universal Protocol:    Date/Time: 12/19/2021  Consent Given By: the patient  Position: PRONE  Additional Comments: Vital signs were monitored before and after the procedure. Patient was prepped and draped in the usual sterile fashion. The correct patient, procedure, and  site was verified.   Injection Procedure Details:   Procedure diagnoses: Lumbar radiculopathy [M54.16]    Meds Administered:  Meds ordered this encounter  Medications   bupivacaine (MARCAINE) 0.5 % (with pres) injection 3 mL   methylPREDNISolone acetate (DEPO-MEDROL) injection 80 mg    Laterality: Left  Location/Site: L5  Needle:5.0 in., 22 ga.  Short bevel or Quincke spinal needle  Needle Placement: Transforaminal  Findings:    -Comments: Excellent flow of contrast along the nerve, nerve root and into the epidural space.  Procedure Details: After squaring off the end-plates to get a true AP view, the C-arm was positioned so that an oblique view of the foramen as noted above was visualized. The target area is just inferior to the "nose of the scotty dog" or sub pedicular. The soft tissues overlying this structure were infiltrated with 2-3 ml. of 1% Lidocaine without Epinephrine.  The spinal needle was inserted toward the target using a "trajectory" view along the fluoroscope beam.  Under AP and lateral visualization, the needle was advanced so it did not puncture dura and was located close the 6 O'Clock position of the pedical in AP tracterory. Biplanar projections were used to confirm position. Aspiration was confirmed to be negative for CSF and/or blood. A 1-2 ml. volume of Isovue-250 was injected and flow of contrast was noted at each level. Radiographs were obtained for documentation purposes.   After attaining the desired flow of contrast documented above, a 0.5 to 1.0 ml  test dose of 0.25% Marcaine was injected into each respective transforaminal space.  The patient was observed for 90 seconds post injection.  After no sensory deficits were reported, and normal lower extremity motor function was noted,   the above injectate was administered so that equal amounts of the injectate were placed at each foramen (level) into the transforaminal epidural space.   Additional Comments:   The patient tolerated the procedure well Dressing: 2 x 2 sterile gauze and Band-Aid    Post-procedure details: Patient was observed during the procedure. Post-procedure instructions were reviewed.  Patient left the clinic in stable condition.    Clinical History: EXAM: MRI LUMBAR SPINE WITHOUT CONTRAST   TECHNIQUE: Multiplanar, multisequence MR imaging of the lumbar spine was performed. No intravenous contrast was administered.   COMPARISON:  Lumbar radiography 12/12/2016 and MRI 12/07/2012   FINDINGS: Segmentation: 5 lumbar type vertebral bodies as numbered previously.   Alignment:  No malalignment.   Vertebrae: Old augmented fracture at T11, not primarily or completely evaluated. No unexpected finding as seen.   Conus medullaris and cauda equina: Conus extends to the L1 level. Conus and cauda equina appear normal.   Paraspinal and other soft tissues: Negative   Disc levels:   T12-L1: Normal   L1-2: Desiccation and mild bulging of the disc. Slight indentation of the thecal sac but no likely compressive stenosis.   L2-3: Mild bulging of the disc.  No compressive stenosis.   L3-4: Minimal bulging of the disc. Mild facet and ligamentous hypertrophy. Mild stenosis of both lateral recesses and neural foramina but without visible neural compression.   L4-5: Degeneration and bulging of the disc more prominent towards the left. Facet and ligamentous hypertrophy. Stenosis of the lateral recesses left more than right. Proximal foraminal narrowing on the left. Some potential for neural compression on the left at this level.   L5-S1: Mild bulging of the disc.  No compressive stenosis.   Note that the right L5 and S1 root sleeves appear conjoined.   Compared to the study of 2014, the left-sided findings at L4-5 have worsened and are probably the most likely to relate to the acute pain syndrome.   IMPRESSION: Worsening of left lateral recess and proximal foraminal  stenosis at L4-5 due to worsening of protruding disc material with potential for compression of the left L4 and L5 nerves.   Chronic degenerative changes at the other levels appear quite similar to the study of 2014, without likely neural compression.     Electronically Signed   By: Nelson Chimes M.D.   On: 10/10/2021 17:12     Objective:  VS:  HT:    WT:   BMI:     BP:105/67  HR:73bpm  TEMP: ( )  RESP:  Physical Exam Vitals and nursing note reviewed.  Constitutional:      General: He is not in acute distress.    Appearance: Normal appearance. He is not ill-appearing.  HENT:     Head: Normocephalic and atraumatic.     Right Ear: External ear normal.     Left Ear: External ear normal.     Nose: No congestion.  Eyes:     Extraocular Movements: Extraocular movements intact.  Cardiovascular:     Rate and Rhythm: Normal rate.     Pulses: Normal pulses.  Pulmonary:     Effort: Pulmonary effort is normal. No respiratory distress.  Abdominal:     General: There is no distension.     Palpations: Abdomen  is soft.  Musculoskeletal:        General: No tenderness or signs of injury.     Cervical back: Neck supple.     Right lower leg: No edema.     Left lower leg: No edema.     Comments: Patient has good distal strength without clonus.  Skin:    Findings: No erythema or rash.  Neurological:     General: No focal deficit present.     Mental Status: He is alert and oriented to person, place, and time.     Sensory: No sensory deficit.     Motor: No weakness or abnormal muscle tone.     Coordination: Coordination normal.  Psychiatric:        Mood and Affect: Mood normal.        Behavior: Behavior normal.      Imaging: No results found.

## 2022-03-20 DIAGNOSIS — Z299 Encounter for prophylactic measures, unspecified: Secondary | ICD-10-CM | POA: Diagnosis not present

## 2022-03-20 DIAGNOSIS — E1165 Type 2 diabetes mellitus with hyperglycemia: Secondary | ICD-10-CM | POA: Diagnosis not present

## 2022-03-20 DIAGNOSIS — Z2821 Immunization not carried out because of patient refusal: Secondary | ICD-10-CM | POA: Diagnosis not present

## 2022-03-20 DIAGNOSIS — F17210 Nicotine dependence, cigarettes, uncomplicated: Secondary | ICD-10-CM | POA: Diagnosis not present

## 2022-03-20 DIAGNOSIS — M549 Dorsalgia, unspecified: Secondary | ICD-10-CM | POA: Diagnosis not present

## 2022-03-20 DIAGNOSIS — I10 Essential (primary) hypertension: Secondary | ICD-10-CM | POA: Diagnosis not present

## 2022-04-14 ENCOUNTER — Ambulatory Visit: Payer: Medicare Other | Attending: Cardiology | Admitting: Cardiology

## 2022-04-14 ENCOUNTER — Encounter: Payer: Self-pay | Admitting: Cardiology

## 2022-04-14 VITALS — BP 130/76 | HR 60 | Ht 68.0 in | Wt 158.2 lb

## 2022-04-14 DIAGNOSIS — I10 Essential (primary) hypertension: Secondary | ICD-10-CM | POA: Diagnosis not present

## 2022-04-14 DIAGNOSIS — I25118 Atherosclerotic heart disease of native coronary artery with other forms of angina pectoris: Secondary | ICD-10-CM | POA: Insufficient documentation

## 2022-04-14 DIAGNOSIS — E782 Mixed hyperlipidemia: Secondary | ICD-10-CM | POA: Insufficient documentation
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
## 2022-04-14 NOTE — Patient Instructions (Signed)
Medication Instructions:  Your physician has recommended you make the following change in your medication:  On 05/13/22, stop taking plavix Continue all other medications as directed  Labwork: none  Testing/Procedures: none  Follow-Up:  Your physician recommends that you schedule a follow-up appointment in: 6 months  Any Other Special Instructions Will Be Listed Below (If Applicable).  If you need a refill on your cardiac medications before your next appointment, please call your pharmacy.

## 2022-05-30 ENCOUNTER — Other Ambulatory Visit: Payer: Self-pay | Admitting: Student

## 2022-06-07 DIAGNOSIS — E119 Type 2 diabetes mellitus without complications: Secondary | ICD-10-CM | POA: Diagnosis not present

## 2022-06-07 DIAGNOSIS — H2513 Age-related nuclear cataract, bilateral: Secondary | ICD-10-CM | POA: Diagnosis not present

## 2022-06-18 DIAGNOSIS — Z1331 Encounter for screening for depression: Secondary | ICD-10-CM | POA: Diagnosis not present

## 2022-06-18 DIAGNOSIS — E7800 Pure hypercholesterolemia, unspecified: Secondary | ICD-10-CM | POA: Diagnosis not present

## 2022-06-18 DIAGNOSIS — Z1339 Encounter for screening examination for other mental health and behavioral disorders: Secondary | ICD-10-CM | POA: Diagnosis not present

## 2022-06-18 DIAGNOSIS — Z7189 Other specified counseling: Secondary | ICD-10-CM | POA: Diagnosis not present

## 2022-06-18 DIAGNOSIS — Z79899 Other long term (current) drug therapy: Secondary | ICD-10-CM | POA: Diagnosis not present

## 2022-06-18 DIAGNOSIS — I10 Essential (primary) hypertension: Secondary | ICD-10-CM | POA: Diagnosis not present

## 2022-06-18 DIAGNOSIS — R5383 Other fatigue: Secondary | ICD-10-CM | POA: Diagnosis not present

## 2022-06-18 DIAGNOSIS — Z6825 Body mass index (BMI) 25.0-25.9, adult: Secondary | ICD-10-CM | POA: Diagnosis not present

## 2022-06-18 DIAGNOSIS — Z0000 Encounter for general adult medical examination without abnormal findings: Secondary | ICD-10-CM | POA: Diagnosis not present

## 2022-06-18 DIAGNOSIS — Z125 Encounter for screening for malignant neoplasm of prostate: Secondary | ICD-10-CM | POA: Diagnosis not present

## 2022-06-18 DIAGNOSIS — Z299 Encounter for prophylactic measures, unspecified: Secondary | ICD-10-CM | POA: Diagnosis not present

## 2022-07-07 DIAGNOSIS — H01001 Unspecified blepharitis right upper eyelid: Secondary | ICD-10-CM | POA: Diagnosis not present

## 2022-07-07 DIAGNOSIS — H01004 Unspecified blepharitis left upper eyelid: Secondary | ICD-10-CM | POA: Diagnosis not present

## 2022-07-07 DIAGNOSIS — H01002 Unspecified blepharitis right lower eyelid: Secondary | ICD-10-CM | POA: Diagnosis not present

## 2022-07-07 DIAGNOSIS — H2513 Age-related nuclear cataract, bilateral: Secondary | ICD-10-CM | POA: Diagnosis not present

## 2022-07-11 DIAGNOSIS — E1165 Type 2 diabetes mellitus with hyperglycemia: Secondary | ICD-10-CM | POA: Diagnosis not present

## 2022-07-11 DIAGNOSIS — Z6825 Body mass index (BMI) 25.0-25.9, adult: Secondary | ICD-10-CM | POA: Diagnosis not present

## 2022-07-11 DIAGNOSIS — I10 Essential (primary) hypertension: Secondary | ICD-10-CM | POA: Diagnosis not present

## 2022-07-11 DIAGNOSIS — F17210 Nicotine dependence, cigarettes, uncomplicated: Secondary | ICD-10-CM | POA: Diagnosis not present

## 2022-07-11 DIAGNOSIS — M549 Dorsalgia, unspecified: Secondary | ICD-10-CM | POA: Diagnosis not present

## 2022-07-11 DIAGNOSIS — Z299 Encounter for prophylactic measures, unspecified: Secondary | ICD-10-CM | POA: Diagnosis not present

## 2022-10-17 DIAGNOSIS — I10 Essential (primary) hypertension: Secondary | ICD-10-CM | POA: Diagnosis not present

## 2022-10-17 DIAGNOSIS — I25118 Atherosclerotic heart disease of native coronary artery with other forms of angina pectoris: Secondary | ICD-10-CM | POA: Diagnosis not present

## 2022-10-17 DIAGNOSIS — M25562 Pain in left knee: Secondary | ICD-10-CM | POA: Diagnosis not present

## 2022-10-17 DIAGNOSIS — Z299 Encounter for prophylactic measures, unspecified: Secondary | ICD-10-CM | POA: Diagnosis not present

## 2022-10-17 DIAGNOSIS — E1165 Type 2 diabetes mellitus with hyperglycemia: Secondary | ICD-10-CM | POA: Diagnosis not present

## 2022-10-17 DIAGNOSIS — F17210 Nicotine dependence, cigarettes, uncomplicated: Secondary | ICD-10-CM | POA: Diagnosis not present

## 2022-10-17 DIAGNOSIS — M549 Dorsalgia, unspecified: Secondary | ICD-10-CM | POA: Diagnosis not present

## 2022-10-20 ENCOUNTER — Encounter: Payer: Self-pay | Admitting: Cardiology

## 2022-10-20 ENCOUNTER — Ambulatory Visit: Payer: Medicare Other | Attending: Cardiology | Admitting: Cardiology

## 2022-10-20 VITALS — BP 108/80 | HR 48 | Ht 68.0 in | Wt 163.2 lb

## 2022-10-20 DIAGNOSIS — E782 Mixed hyperlipidemia: Secondary | ICD-10-CM | POA: Insufficient documentation

## 2022-10-20 DIAGNOSIS — R001 Bradycardia, unspecified: Secondary | ICD-10-CM | POA: Insufficient documentation

## 2022-10-20 DIAGNOSIS — I10 Essential (primary) hypertension: Secondary | ICD-10-CM | POA: Diagnosis not present

## 2022-10-20 DIAGNOSIS — I25118 Atherosclerotic heart disease of native coronary artery with other forms of angina pectoris: Secondary | ICD-10-CM | POA: Insufficient documentation

## 2022-10-20 MED ORDER — NITROGLYCERIN 0.4 MG SL SUBL: 0.4000 mg | SUBLINGUAL_TABLET | SUBLINGUAL | 3 refills | Status: DC | PRN

## 2022-10-20 NOTE — Patient Instructions (Addendum)
Medication Instructions:   Continue all current medications.  Nitroglycerin refilled today.   Labwork:  none  Testing/Procedures:  none  Follow-Up:  6 months   Any Other Special Instructions Will Be Listed Below (If Applicable).   If you need a refill on your cardiac medications before your next appointment, please call your pharmacy.
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
## 2022-11-11 DIAGNOSIS — M25552 Pain in left hip: Secondary | ICD-10-CM | POA: Diagnosis not present

## 2022-11-11 DIAGNOSIS — I10 Essential (primary) hypertension: Secondary | ICD-10-CM | POA: Diagnosis not present

## 2022-11-11 DIAGNOSIS — Z79899 Other long term (current) drug therapy: Secondary | ICD-10-CM | POA: Diagnosis not present

## 2022-11-11 DIAGNOSIS — M1612 Unilateral primary osteoarthritis, left hip: Secondary | ICD-10-CM | POA: Diagnosis not present

## 2022-11-11 DIAGNOSIS — E119 Type 2 diabetes mellitus without complications: Secondary | ICD-10-CM | POA: Diagnosis not present

## 2022-11-11 DIAGNOSIS — E785 Hyperlipidemia, unspecified: Secondary | ICD-10-CM | POA: Diagnosis not present

## 2022-11-11 DIAGNOSIS — M160 Bilateral primary osteoarthritis of hip: Secondary | ICD-10-CM | POA: Diagnosis not present

## 2022-11-18 DIAGNOSIS — M5137 Other intervertebral disc degeneration, lumbosacral region: Secondary | ICD-10-CM | POA: Diagnosis not present

## 2022-11-18 DIAGNOSIS — M1612 Unilateral primary osteoarthritis, left hip: Secondary | ICD-10-CM | POA: Diagnosis not present

## 2022-11-18 DIAGNOSIS — M533 Sacrococcygeal disorders, not elsewhere classified: Secondary | ICD-10-CM | POA: Diagnosis not present

## 2022-11-28 DIAGNOSIS — M1612 Unilateral primary osteoarthritis, left hip: Secondary | ICD-10-CM | POA: Diagnosis not present

## 2022-12-18 DIAGNOSIS — M5137 Other intervertebral disc degeneration, lumbosacral region: Secondary | ICD-10-CM | POA: Diagnosis not present

## 2022-12-18 DIAGNOSIS — M1612 Unilateral primary osteoarthritis, left hip: Secondary | ICD-10-CM | POA: Diagnosis not present

## 2022-12-18 DIAGNOSIS — M533 Sacrococcygeal disorders, not elsewhere classified: Secondary | ICD-10-CM | POA: Diagnosis not present

## 2022-12-29 DIAGNOSIS — N401 Enlarged prostate with lower urinary tract symptoms: Secondary | ICD-10-CM | POA: Diagnosis not present

## 2022-12-29 DIAGNOSIS — N138 Other obstructive and reflux uropathy: Secondary | ICD-10-CM | POA: Diagnosis not present

## 2022-12-29 DIAGNOSIS — N35819 Other urethral stricture, male, unspecified site: Secondary | ICD-10-CM | POA: Diagnosis not present

## 2023-01-21 DIAGNOSIS — I10 Essential (primary) hypertension: Secondary | ICD-10-CM | POA: Diagnosis not present

## 2023-01-21 DIAGNOSIS — I25118 Atherosclerotic heart disease of native coronary artery with other forms of angina pectoris: Secondary | ICD-10-CM | POA: Diagnosis not present

## 2023-01-21 DIAGNOSIS — Z299 Encounter for prophylactic measures, unspecified: Secondary | ICD-10-CM | POA: Diagnosis not present

## 2023-01-21 DIAGNOSIS — L84 Corns and callosities: Secondary | ICD-10-CM | POA: Diagnosis not present

## 2023-01-21 DIAGNOSIS — E1165 Type 2 diabetes mellitus with hyperglycemia: Secondary | ICD-10-CM | POA: Diagnosis not present

## 2023-01-21 DIAGNOSIS — F17210 Nicotine dependence, cigarettes, uncomplicated: Secondary | ICD-10-CM | POA: Diagnosis not present

## 2023-02-10 ENCOUNTER — Ambulatory Visit: Payer: Medicare Other | Admitting: Podiatry

## 2023-02-18 DIAGNOSIS — R1012 Left upper quadrant pain: Secondary | ICD-10-CM | POA: Diagnosis not present

## 2023-02-18 DIAGNOSIS — D126 Benign neoplasm of colon, unspecified: Secondary | ICD-10-CM | POA: Diagnosis not present

## 2023-04-10 ENCOUNTER — Encounter: Payer: Self-pay | Admitting: Cardiology

## 2023-04-10 ENCOUNTER — Ambulatory Visit: Payer: Medicare Other | Attending: Cardiology | Admitting: Cardiology

## 2023-04-10 VITALS — BP 134/70 | HR 58 | Ht 68.0 in | Wt 168.0 lb

## 2023-04-10 DIAGNOSIS — E782 Mixed hyperlipidemia: Secondary | ICD-10-CM

## 2023-04-10 DIAGNOSIS — I25118 Atherosclerotic heart disease of native coronary artery with other forms of angina pectoris: Secondary | ICD-10-CM | POA: Diagnosis not present

## 2023-04-10 DIAGNOSIS — I10 Essential (primary) hypertension: Secondary | ICD-10-CM
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
## 2023-04-10 NOTE — Patient Instructions (Signed)
Medication Instructions:  Continue all current medications.   Labwork: none  Testing/Procedures: none  Follow-Up: 6 months   Any Other Special Instructions Will Be Listed Below (If Applicable).   If you need a refill on your cardiac medications before your next appointment, please call your pharmacy.  

## 2023-05-06 DIAGNOSIS — E1169 Type 2 diabetes mellitus with other specified complication: Secondary | ICD-10-CM | POA: Diagnosis not present

## 2023-05-06 DIAGNOSIS — R52 Pain, unspecified: Secondary | ICD-10-CM | POA: Diagnosis not present

## 2023-05-06 DIAGNOSIS — I10 Essential (primary) hypertension: Secondary | ICD-10-CM | POA: Diagnosis not present

## 2023-05-06 DIAGNOSIS — Z299 Encounter for prophylactic measures, unspecified: Secondary | ICD-10-CM | POA: Diagnosis not present

## 2023-05-15 ENCOUNTER — Other Ambulatory Visit: Payer: Self-pay | Admitting: Student

## 2023-10-15 ENCOUNTER — Ambulatory Visit: Attending: Cardiology | Admitting: Cardiology

## 2023-10-15 ENCOUNTER — Encounter: Payer: Self-pay | Admitting: Cardiology

## 2023-10-15 VITALS — BP 140/85 | HR 56 | Ht 68.0 in | Wt 168.0 lb

## 2023-10-15 DIAGNOSIS — I10 Essential (primary) hypertension: Secondary | ICD-10-CM

## 2023-10-15 DIAGNOSIS — I25118 Atherosclerotic heart disease of native coronary artery with other forms of angina pectoris: Secondary | ICD-10-CM | POA: Diagnosis not present

## 2023-10-15 DIAGNOSIS — E782 Mixed hyperlipidemia: Secondary | ICD-10-CM

## 2023-10-15 MED ORDER — LISINOPRIL 20 MG PO TABS: 20.0000 mg | ORAL_TABLET | Freq: Every day | ORAL | 6 refills | Status: DC
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
## 2023-10-15 NOTE — Patient Instructions (Signed)
 Medication Instructions:   Increase Lisinopril  to 20mg  daily  Continue all other medications.     Labwork:  none  Testing/Procedures:  none  Follow-Up:  6 months   Any Other Special Instructions Will Be Listed Below (If Applicable).   If you need a refill on your cardiac medications before your next appointment, please call your pharmacy.

## 2023-11-12 ENCOUNTER — Other Ambulatory Visit: Payer: Self-pay | Admitting: Cardiology

## 2024-01-16 ENCOUNTER — Other Ambulatory Visit: Payer: Self-pay | Admitting: Cardiology

## 2024-05-06 ENCOUNTER — Other Ambulatory Visit: Payer: Self-pay | Admitting: Cardiology

## 2024-05-12 ENCOUNTER — Encounter: Payer: Self-pay | Admitting: Cardiology

## 2024-05-12 ENCOUNTER — Ambulatory Visit: Attending: Cardiology | Admitting: Cardiology

## 2024-05-12 VITALS — BP 120/70 | HR 52 | Ht 68.0 in | Wt 166.4 lb

## 2024-05-12 DIAGNOSIS — I25118 Atherosclerotic heart disease of native coronary artery with other forms of angina pectoris: Secondary | ICD-10-CM | POA: Diagnosis not present

## 2024-05-12 DIAGNOSIS — I10 Essential (primary) hypertension: Secondary | ICD-10-CM
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
## 2024-05-12 NOTE — Patient Instructions (Signed)
